# Patient Record
Sex: Male | Born: 1955 | Race: White | Hispanic: Yes | Smoking: Former smoker
Health system: Southern US, Community
[De-identification: ages and names within clinical notes are randomized; demographics above are authoritative.]

## PROBLEM LIST (undated history)

## (undated) DIAGNOSIS — E785 Hyperlipidemia, unspecified: Secondary | ICD-10-CM

## (undated) DIAGNOSIS — B1920 Unspecified viral hepatitis C without hepatic coma: Secondary | ICD-10-CM

## (undated) DIAGNOSIS — R7303 Prediabetes: Secondary | ICD-10-CM

## (undated) HISTORY — PX: TENDON REPAIR: SHX5111

## (undated) HISTORY — DX: Hyperlipidemia, unspecified: E78.5

## (undated) HISTORY — PX: HERNIA REPAIR: SHX51

---

## 2014-08-16 DIAGNOSIS — S46009A Unspecified injury of muscle(s) and tendon(s) of the rotator cuff of unspecified shoulder, initial encounter: Secondary | ICD-10-CM | POA: Insufficient documentation

## 2020-07-26 ENCOUNTER — Other Ambulatory Visit: Payer: Self-pay

## 2020-07-26 ENCOUNTER — Encounter (HOSPITAL_BASED_OUTPATIENT_CLINIC_OR_DEPARTMENT_OTHER): Payer: Self-pay | Admitting: Emergency Medicine

## 2020-07-26 ENCOUNTER — Emergency Department (HOSPITAL_BASED_OUTPATIENT_CLINIC_OR_DEPARTMENT_OTHER)
Admission: EM | Admit: 2020-07-26 | Discharge: 2020-07-26 | Disposition: A | Discharge status: Home / Self Care | Payer: 59 | Attending: Emergency Medicine | Admitting: Emergency Medicine

## 2020-07-26 ENCOUNTER — Emergency Department (HOSPITAL_BASED_OUTPATIENT_CLINIC_OR_DEPARTMENT_OTHER): Payer: 59 | Admitting: Radiology

## 2020-07-26 ENCOUNTER — Emergency Department (HOSPITAL_BASED_OUTPATIENT_CLINIC_OR_DEPARTMENT_OTHER): Payer: 59

## 2020-07-26 DIAGNOSIS — R600 Localized edema: Secondary | ICD-10-CM | POA: Diagnosis not present

## 2020-07-26 DIAGNOSIS — M25461 Effusion, right knee: Secondary | ICD-10-CM

## 2020-07-26 DIAGNOSIS — M25561 Pain in right knee: Secondary | ICD-10-CM | POA: Diagnosis present

## 2020-07-26 MED ORDER — OXYCODONE HCL 5 MG PO TABS
5.0000 mg | ORAL_TABLET | Freq: Once | ORAL | Status: AC
  Administered 2020-07-26: 5 mg via ORAL
  Filled 2020-07-26: qty 1

## 2020-07-26 MED ORDER — OXYCODONE HCL 5 MG PO TABS: 5.0000 mg | ORAL_TABLET | Freq: Every evening | ORAL | 0 refills | Status: AC | PRN

## 2020-07-26 NOTE — Discharge Instructions (Addendum)
Orthopedic Surgery Urgent [facility]           [address],  [place]     [facility]ist [address] [place], [place]  [phone] Closed  Opens tomorrow 5:30 PM  DIRECTIONS  WEBSITE     [facility]: [facility]opedic [facility]gent [facility] Orthopedist [address], [place]  DIRECTIONS  WEBSITE     [facility] Facebook [phone])  Orthopedist [address] [place]  [phone] Closed  Opens tomorrow 8 AM  DIRECTIONS  WEBSITE     [facility] (5)  Sports medicine [address] [place], [place]  [phone] Closed  Opens tomorrow 8:30 AM  DIRECTIONS  [facility] Orthopedist [address], [place]  [phone] Closed  Opens tomorrow 8 AM  DIRECTIONS  WEBSITE   See more results [facility]   [email or web address]   Mar 27, 2017  [facility] is Now Open: Monday -Friday: 8:00 AM to 8:00 PM. Saturday: 10:00 AM to 3:00 PM. When . Saturday: By Appointment Estimated Reading Time: 1 min    [facility]  [email or web address]   Orthopedic Urgent Care [address] [place], [place] [address] EVENINGS & WEEKENDS NO APPOINTMENT NECESSARY Mon-Fri 5:30PM - 9PM Sat 9 AM - 2 PM Sun 10 AM - 2 PM .

## 2020-07-26 NOTE — ED Triage Notes (Signed)
Pt arrives to ED with c/o increased right knee pain x2 days. Pt has chronic knee pain and has had right knee aspiration and Toradol injection into knee in June (6/13). Pt was in for MRI for meniscus tear vs fracture but was unable to do MRI due to insurance issues. Pt has hx of positive McMurrays sign and hx of osteoarthritis.Pt reports selling has increased drastically over last two days.

## 2020-07-26 NOTE — ED Provider Notes (Signed)
[facility] EMERGENCY DEPT Provider Note   CSN: [id number] Arrival date & time: 07/26/20  1645     History Chief Complaint  Patient presents with   Knee Pain    Brian Oliver is a 65 y.o. male.  Brian Oliver is under the care of orthopedics for his knee.  He was doing a job which was exertional, and he developed pain.  Unfortunately, he had to stop seeing the specialist because of lack of insurance.  He currently has insurance, and he has an appointment in August.  However, over the past 3 days, without obvious inciting cause, he has developed worsening pain.  Pain is mostly at the lateral aspect of his knee but also on the posterior aspect.  He also has diffuse swelling of the right lower extremity.  The history is provided by the patient.  Knee Pain Location:  Knee Time since incident:  8 months Knee location:  R knee Pain details:    Quality:  Throbbing   Radiates to:  Does not radiate   Severity:  Moderate   Onset quality:  Gradual   Duration:  8 months   Timing:  Constant   Progression:  Worsening (worse x 3 days) Chronicity:  New Dislocation: no   Foreign body present:  No foreign bodies Prior injury to area:  No Relieved by:  Nothing Worsened by:  Bearing weight Ineffective treatments:  NSAIDs Associated symptoms: decreased ROM and swelling   Associated symptoms: no back pain, no fever, no numbness, no stiffness and no tingling          History reviewed. No pertinent family history.     Home Medications Prior to Admission medications   Not on File    Allergies    Patient has no allergy information on record.  Review of Systems   Review of Systems  Constitutional:  Negative for chills and fever.  HENT:  Negative for ear pain and sore throat.   Eyes:  Negative for pain and visual disturbance.  Respiratory:  Negative for cough and shortness of breath.   Cardiovascular:  Positive for leg swelling. Negative for chest pain and  palpitations.  Gastrointestinal:  Negative for abdominal pain and vomiting.  Genitourinary:  Negative for dysuria and hematuria.  Musculoskeletal:  Positive for joint swelling. Negative for arthralgias, back pain and stiffness.  Skin:  Negative for color change and rash.  Neurological:  Negative for seizures and syncope.  All other systems reviewed and are negative.  Physical Exam Updated Vital Signs BP (!) 141/68   Pulse 67   Temp 97.9 F (36.6 C) (Oral)   Resp 18   Ht 5\' 5"  (1.651 m)   Wt 84.8 kg   SpO2 100%   BMI 31.12 kg/m   Physical Exam Vitals and nursing note reviewed.  Constitutional:      Appearance: Normal appearance.  HENT:     Head: Normocephalic and atraumatic.  Eyes:     Conjunctiva/sclera: Conjunctivae normal.  Pulmonary:     Effort: Pulmonary effort is normal. No respiratory distress.  Musculoskeletal:        General: No deformity. Normal range of motion.     Cervical back: Normal range of motion.     Right lower leg: Edema present.     Comments: There is no deformity of the right knee.  There is a 2+ effusion and diffuse tenderness to palpation.  Range of motion is from 0 to 30 degrees and is painful.  Further provocative testing  was not performed secondary to his pain.  He also has swelling of the entire right lower leg.  Distal pulses are palpable.  He has a positive Homans' sign.  Sensation is normal.  Skin:    General: Skin is warm and dry.  Neurological:     General: No focal deficit present.     Mental Status: He is alert and oriented to person, place, and time. Mental status is at baseline.  Psychiatric:        Mood and Affect: Mood normal.    ED Results / Procedures / Treatments   Labs (all labs ordered are listed, but only abnormal results are displayed) Labs Reviewed - No data to display  EKG None  Radiology US Venous Img Lower Unilateral Right  Result Date: 07/26/2020 CLINICAL DATA:  Right knee pain/swelling, chronic EXAM: RIGHT  LOWER EXTREMITY VENOUS DOPPLER ULTRASOUND TECHNIQUE: Gray-scale sonography with compression, as well as color and duplex ultrasound, were performed to evaluate the deep venous system(s) from the level of the common femoral vein through the popliteal and proximal calf veins. COMPARISON:  None. FINDINGS: VENOUS Normal compressibility of the common femoral, superficial femoral, and popliteal veins, as well as the visualized calf veins. Visualized portions of profunda femoral vein and great saphenous vein unremarkable. No filling defects to suggest DVT on grayscale or color Doppler imaging. Doppler waveforms show normal direction of venous flow, normal respiratory plasticity and response to augmentation. Limited views of the contralateral common femoral vein are unremarkable. OTHER None. Limitations: none IMPRESSION: Negative. Electronically Signed   By: Charline Bills M.D.   On: 07/26/2020 22:31   DG Knee Complete 4 Views Right  Result Date: 07/26/2020 CLINICAL DATA:  Knee pain and swelling.  Increased pain for 2 days. EXAM: RIGHT KNEE - COMPLETE 4+ VIEW COMPARISON:  None. FINDINGS: No fracture or dislocation. Normal alignment. Mild tibiofemoral joint space narrowing. Mild tricompartmental peripheral spurs. Mild spurring of the tibial spines. No erosion, bone destruction or periosteal reaction. There is a moderate knee joint effusion. Generalized soft tissue edema. IMPRESSION: 1. Moderate knee joint effusion and generalized soft tissue edema. 2. Mild tricompartmental osteoarthritis. No acute osseous abnormality. Electronically Signed   By: Narda Rutherford M.D.   On: 07/26/2020 19:41    Procedures Procedures   Medications Ordered in ED Medications  oxyCODONE (Oxy IR/ROXICODONE) immediate release tablet 5 mg (has no administration in time range)    ED Course  I have reviewed the triage vital signs and the nursing notes.  Pertinent labs & imaging results that were available during my care of the  patient were reviewed by me and considered in my medical decision making (see chart for details).    MDM Rules/Calculators/A&P                           Vitor Overbaugh has had chronic knee pain for a little less than a year.  Follow-up has been delayed due to changing jobs and a change in insurances.  He presented today with acute on chronic pain.  Symptoms are consistent with his chronic knee pain.  X-ray revealed known, mild osteoarthritis and a knee effusion.  This does not appear to be consistent with a septic joint.  He also has some lower extremity edema that I was concerned might be a DVT.  I was concerned that his relative sedentary nature since his injury had contributed to a risk for venous thromboembolism.  Ultrasound was negative for  this pathology.  It is possible he has some dependent edema or even a ruptured popliteal cyst.  He was given oxycodone for use at nighttime only and advised on side effects of this medication.  He was presented with a list of orthopedic urgent cares and their contact information.  He does have follow-up in August with another doctor, but if he wants to be seen sooner than that, he was given resources to do so. Final Clinical Impression(s) / ED Diagnoses Final diagnoses:  Effusion of right knee  Edema of right lower leg    Rx / DC Orders ED Discharge Orders          Ordered    oxyCODONE (ROXICODONE) 5 MG immediate release tablet  At bedtime PRN        07/26/20 2315             Koleen Distance, MD 07/26/20 2317

## 2020-08-08 ENCOUNTER — Other Ambulatory Visit: Payer: Self-pay

## 2020-08-08 ENCOUNTER — Encounter (HOSPITAL_BASED_OUTPATIENT_CLINIC_OR_DEPARTMENT_OTHER): Payer: Self-pay | Admitting: Family Medicine

## 2020-08-08 ENCOUNTER — Ambulatory Visit (INDEPENDENT_AMBULATORY_CARE_PROVIDER_SITE_OTHER): Payer: 59 | Admitting: Family Medicine

## 2020-08-08 DIAGNOSIS — E785 Hyperlipidemia, unspecified: Secondary | ICD-10-CM

## 2020-08-08 DIAGNOSIS — M25461 Effusion, right knee: Secondary | ICD-10-CM | POA: Diagnosis not present

## 2020-08-08 DIAGNOSIS — M25361 Other instability, right knee: Secondary | ICD-10-CM | POA: Diagnosis not present

## 2020-08-08 DIAGNOSIS — E669 Obesity, unspecified: Secondary | ICD-10-CM | POA: Diagnosis not present

## 2020-08-08 NOTE — Progress Notes (Signed)
New Patient Office Visit  Subjective:  Patient ID: Brian Oliver, male    DOB: 1955/05/12  Age: 65 y.o. MRN: [id number]  CC:  Chief Complaint  Patient presents with   Establish Care    No Previous PCP   Knee Injury    Patient presents today with right knee pain and swelling that radiates down into his leg towards his shin since December 2021. Patient thinks the initial injury came from lifting weights. He was being treated by Ortho at [facility]. Patient states he has taken meloxicam and naproxen for the pain with no relief. He states his last Orthopedic doc drained fluid off of the knee and gave him steroid injections which gave him minimal relief. He was advised to have an MRI but never did due to insurance.   Hyperlipidemia    Patient states he has been diagnosed with hyperlipidemia in the past based on labs but has not been prescribed any medication to manage.     HPI Brian Oliver is a 65 year old male presenting to establish in clinic.  He is accompanied today by his son.  Virtual medical interpreter available to assist in patient encounter.  He has current issues related to right knee pain and swelling.  Reports past medical history of hyperlipidemia.  Right knee pain/swelling: Pain initially started about 10 months ago.  At that time, there was no specific injury, pain developed after working to do a lot of unloading with moderately heavy lifting about 25 to 50 pounds.  He was seen by orthopedic surgeon initially with knee aspiration and injection completed.  Aspiration did not reveal any intra-articular crystals, no evidence of infection at that time.  Had intra-articular steroid injection completed which did provide some relief, however was not long lasting.  He also had a Toradol injection as well as subsequent intra-articular steroid injection which was most recently completed in June.  The second intra-articular steroid injection did not provide notable relief.  Patient has  been using NSAID to help with controlling symptoms.  Pain is worse with walking, prolonged standing, bending the knee.  Also has some associated crepitus.  Knee will also feel unstable at times when walking.  Most recently, was seen in the emergency department for evaluation due to persistent pain and swelling as well as some swelling in his lower leg.  At that time ultrasound was completed to rule out DVT and this was unremarkable.  Patient has had a change of insurance and thus is not able to follow-up with orthopedic surgeon that he was seeing.  Hyperlipidemia: Reports being told about this in the past, no specific medications reported.  Has not had recent PCP.  Unsure of when most recent labs were completed.  Past Medical History:  Diagnosis Date   Hyperlipidemia     History reviewed. No pertinent surgical history.  Family History  Problem Relation Age of Onset   Hypertension Mother    Stroke Father     Social History   Socioeconomic History   Marital status: Married    Spouse name: Not on file   Number of children: Not on file   Years of education: Not on file   Highest education level: Not on file  Occupational History   Not on file  Tobacco Use   Smoking status: Never    Passive exposure: Never   Smokeless tobacco: Never  Vaping Use   Vaping Use: Never used  Substance and Sexual Activity   Alcohol use: Yes  Alcohol/week: 1.0 standard drink    Types: 1 Cans of beer per week   Drug use: Not on file   Sexual activity: Not Currently  Other Topics Concern   Not on file  Social History Narrative   Not on file   Social Determinants of Health   Financial Resource Strain: Not on file  Food Insecurity: Not on file  Transportation Needs: Not on file  Physical Activity: Not on file  Stress: Not on file  Social Connections: Not on file  Intimate Partner Violence: Not on file   Objective:   Today's Vitals: BP 132/72   Pulse 77   Ht 5\' 5"  (1.651 m)   Wt 193 lb  3.2 oz (87.6 kg)   SpO2 97%   BMI 32.15 kg/m   Physical Exam  65 year old male in no acute distress Cardiovascular exam with regular rate and rhythm, no murmurs appreciated Lungs clear to auscultation bilaterally Right knee: No obvious deformity. Moderate effusion.  Negative patellar grind.  Positive crepitus. Full ROM for flexion and extension.  Strength 5 out of 5 for flexion and extension. Anterior drawer: Negative Posterior drawer: Negative Lachman: Negative Varus stress test: Negative Valgus stress test: Positive McMurray's: Positive Thessaly: Positive Neurovascularly intact.  No evidence of lymphatic disease.   Assessment & Plan:   Problem List Items Addressed This Visit       Musculoskeletal and Integument   Effusion of right knee    See below         Other   Hyperlipidemia    Noted in the past, will check labs today and determine ASCVD risk score and determine need for statin therapy       Obesity, Class I, BMI 30-34.9    Check labs as below Current musculoskeletal issues are limiting ability to be physically active Focus on alternative lifestyle modifications related to diet Monitor weight at future visits Hopefully with resolution of right knee issues, can transition into increasing physical activity       Instability of right knee joint    Patient with chronic right knee pain with associated effusion, instability Concern for internal derangement, possible meniscal tear, possible bony abnormality Reviewed x-rays completed most recently in the emergency department, evidence of joint effusion, joint space narrowing Given effusion, instability, lack of improvement with conservative measures thus far, will order MRI to evaluate further Can continue with conservative measures to help with pain control, recommend elevation of the leg to help with reducing swelling, discussed that this would need to be higher than the level of the heart in order to be of  benefit        No outpatient encounter medications on file as of 08/08/2020.   No facility-administered encounter medications on file as of 08/08/2020.   Spent 45 minutes on this patient encounter, including preparation, chart review, face-to-face counseling with patient and coordination of care, and documentation of encounter  Follow-up: No follow-ups on file.  We will arrange for follow-up once MRI is approved and scheduled  Pavel Gadd J De 10/08/2020, MD

## 2020-08-08 NOTE — Assessment & Plan Note (Signed)
Noted in the past, will check labs today and determine ASCVD risk score and determine need for statin therapy

## 2020-08-08 NOTE — Assessment & Plan Note (Signed)
See below

## 2020-08-08 NOTE — Assessment & Plan Note (Signed)
Check labs as below Current musculoskeletal issues are limiting ability to be physically active Focus on alternative lifestyle modifications related to diet Monitor weight at future visits Hopefully with resolution of right knee issues, can transition into increasing physical activity

## 2020-08-08 NOTE — Patient Instructions (Signed)
  Medication Instructions:  Your physician recommends that you continue on your current medications as directed. Please refer to the Current Medication list given to you today. --If you need a refill on any your medications before your next appointment, please call your pharmacy first. If no refills are authorized on file call the office.--  Lab Work: Your physician has recommended that you have lab work today: CBC, CMP, Lipid Profile, and A1C If you have labs (blood work) drawn today and your tests are completely normal, you will receive your results via MyChart message OR a phone call from our staff.  Please ensure you check your voicemail in the event that you authorized detailed messages to be left on a delegated number. If you have any lab test that is abnormal or we need to change your treatment, we will call you to review the results.  Referrals/Procedures/Imaging: Your physician has requested that you have an MRI or your right knee. Magnetic resonance imaging, or MRI, is a noninvasive medical imaging test that produces detailed images of almost every internal structure in the human body, including the organs, bones, muscles and blood vessels. MRI scanners create images of the body using a large magnet and radio waves.  Follow-Up: Your next appointment:   Your physician recommends that you schedule a follow-up appointment TBD with Dr. de Peru  Thanks for letting us be apart of your health journey!!  Primary Care and Sports Medicine   Dr. Ceasar Mons Peru   We encourage you to activate your patient portal called "MyChart".  Sign up information is provided on this After Visit Summary.  MyChart is used to connect with patients for Virtual Visits (Telemedicine).  Patients are able to view lab/test results, encounter notes, upcoming appointments, etc.  Non-urgent messages can be sent to your provider as well. To learn more about what you can do with MyChart, please visit --   [email or web address].

## 2020-08-08 NOTE — Assessment & Plan Note (Signed)
Patient with chronic right knee pain with associated effusion, instability Concern for internal derangement, possible meniscal tear, possible bony abnormality Reviewed x-rays completed most recently in the emergency department, evidence of joint effusion, joint space narrowing Given effusion, instability, lack of improvement with conservative measures thus far, will order MRI to evaluate further Can continue with conservative measures to help with pain control, recommend elevation of the leg to help with reducing swelling, discussed that this would need to be higher than the level of the heart in order to be of benefit

## 2020-08-09 LAB — CBC WITH DIFFERENTIAL/PLATELET
Basophils Absolute: 0 10*3/uL (ref 0.0–0.2)
Basos: 1 %
EOS (ABSOLUTE): 0.2 10*3/uL (ref 0.0–0.4)
Eos: 3 %
Hematocrit: 34.3 % — ABNORMAL LOW (ref 37.5–51.0)
Hemoglobin: 11.7 g/dL — ABNORMAL LOW (ref 13.0–17.7)
Immature Grans (Abs): 0 10*3/uL (ref 0.0–0.1)
Immature Granulocytes: 0 %
Lymphocytes Absolute: 1.4 10*3/uL (ref 0.7–3.1)
Lymphs: 23 %
MCH: 31.3 pg (ref 26.6–33.0)
MCHC: 34.1 g/dL (ref 31.5–35.7)
MCV: 92 fL (ref 79–97)
Monocytes Absolute: 0.4 10*3/uL (ref 0.1–0.9)
Monocytes: 7 %
Neutrophils Absolute: 4.1 10*3/uL (ref 1.4–7.0)
Neutrophils: 66 %
Platelets: 328 10*3/uL (ref 150–450)
RBC: 3.74 x10E6/uL — ABNORMAL LOW (ref 4.14–5.80)
RDW: 13.2 % (ref 11.6–15.4)
WBC: 6.2 10*3/uL (ref 3.4–10.8)

## 2020-08-09 LAB — COMPREHENSIVE METABOLIC PANEL
ALT: 11 IU/L (ref 0–44)
AST: 13 IU/L (ref 0–40)
Albumin/Globulin Ratio: 1.1 — ABNORMAL LOW (ref 1.2–2.2)
Albumin: 3.9 g/dL (ref 3.8–4.8)
Alkaline Phosphatase: 90 IU/L (ref 44–121)
BUN/Creatinine Ratio: 26 — ABNORMAL HIGH (ref 10–24)
BUN: 19 mg/dL (ref 8–27)
Bilirubin Total: 0.2 mg/dL (ref 0.0–1.2)
CO2: 23 mmol/L (ref 20–29)
Calcium: 9 mg/dL (ref 8.6–10.2)
Chloride: 105 mmol/L (ref 96–106)
Creatinine, Ser: 0.74 mg/dL — ABNORMAL LOW (ref 0.76–1.27)
Globulin, Total: 3.5 g/dL (ref 1.5–4.5)
Glucose: 89 mg/dL (ref 65–99)
Potassium: 4.3 mmol/L (ref 3.5–5.2)
Sodium: 141 mmol/L (ref 134–144)
Total Protein: 7.4 g/dL (ref 6.0–8.5)
eGFR: 100 mL/min/{1.73_m2} (ref >59)

## 2020-08-09 LAB — HEMOGLOBIN A1C
Est. average glucose Bld gHb Est-mCnc: 126 mg/dL
Hgb A1c MFr Bld: 6 % — ABNORMAL HIGH (ref 4.8–5.6)

## 2020-08-09 LAB — LIPID PANEL
Chol/HDL Ratio: 4.9 ratio (ref 0.0–5.0)
Cholesterol, Total: 215 mg/dL — ABNORMAL HIGH (ref 100–199)
HDL: 44 mg/dL (ref >39)
LDL Chol Calc (NIH): 146 mg/dL — ABNORMAL HIGH (ref 0–99)
Triglycerides: 137 mg/dL (ref 0–149)
VLDL Cholesterol Cal: 25 mg/dL (ref 5–40)

## 2020-08-10 ENCOUNTER — Other Ambulatory Visit (HOSPITAL_BASED_OUTPATIENT_CLINIC_OR_DEPARTMENT_OTHER): Payer: Self-pay

## 2020-08-14 ENCOUNTER — Telehealth (HOSPITAL_BASED_OUTPATIENT_CLINIC_OR_DEPARTMENT_OTHER): Payer: Self-pay

## 2020-08-14 NOTE — Telephone Encounter (Signed)
Per DPR spoke with patients son He is aware and agreeable to lab results and recommendations Patients son will schedule follow up after MRI is complete

## 2020-08-14 NOTE — Telephone Encounter (Signed)
-----   Message from Hosie Poisson Peru, MD sent at 08/09/2020  8:51 AM EDT ----- Normal white blood cell count with slightly low red blood cell count and slightly low hemoglobin.  Normal electrolytes, kidney function and liver function.  Hemoglobin A1c is 6.0% which falls within "prediabetes" range.  This indicates increased risk of developing diabetes in the future.  Can discuss management further at next office visit. Lipid panel with elevated total cholesterol and elevated "bad" cholesterol.  Would focus on lifestyle modifications to address cholesterol issues.  This will include dietary changes - incorporating fresh fruits and vegetables, lean protein in the diet and reducing consumption of red meats, saturated fats, processed foods.

## 2020-08-20 ENCOUNTER — Other Ambulatory Visit: Payer: Self-pay

## 2020-08-20 ENCOUNTER — Ambulatory Visit (INDEPENDENT_AMBULATORY_CARE_PROVIDER_SITE_OTHER): Payer: 59

## 2020-08-20 DIAGNOSIS — M25461 Effusion, right knee: Secondary | ICD-10-CM | POA: Diagnosis not present

## 2020-08-20 DIAGNOSIS — M25361 Other instability, right knee: Secondary | ICD-10-CM | POA: Diagnosis not present

## 2020-08-25 ENCOUNTER — Encounter (HOSPITAL_BASED_OUTPATIENT_CLINIC_OR_DEPARTMENT_OTHER): Payer: Self-pay | Admitting: Family Medicine

## 2020-08-25 ENCOUNTER — Other Ambulatory Visit: Payer: Self-pay

## 2020-08-25 ENCOUNTER — Ambulatory Visit (INDEPENDENT_AMBULATORY_CARE_PROVIDER_SITE_OTHER): Payer: 59 | Admitting: Family Medicine

## 2020-08-25 VITALS — BP 160/78 | HR 67 | Ht 65.0 in | Wt 190.6 lb

## 2020-08-25 DIAGNOSIS — R7303 Prediabetes: Secondary | ICD-10-CM | POA: Diagnosis not present

## 2020-08-25 DIAGNOSIS — M25461 Effusion, right knee: Secondary | ICD-10-CM

## 2020-08-25 DIAGNOSIS — E785 Hyperlipidemia, unspecified: Secondary | ICD-10-CM | POA: Diagnosis not present

## 2020-08-25 NOTE — Assessment & Plan Note (Signed)
Unremarkable since last visit, results reviewed with patient and son who accompanied patient to visit today Complex effusion observed, degenerative changes of joint, degenerative tearing of meniscus, reactive marrow edema Reviewed prior labs and notes from prior orthopedic surgery visits; synovial fluid analysis was completed earlier this year.  At that time, no crystals were observed, patient had elevated polynuclear cells. Blood work with prior PCP did reveal a positive rheumatoid factor.  With orthopedic surgery, patient has had intra-articular corticosteroid injection as well as intra-articular Toradol injection previously. Given lack of response to injections, findings on prior synovial fluid analysis, findings on recent MRI, will refer to orthopedic surgery for further evaluation and treatment recommendations

## 2020-08-25 NOTE — Progress Notes (Signed)
    Procedures performed today:    None.  Independent interpretation of notes and tests performed by another provider:   None.  Brief History, Exam, Impression, and Recommendations:    BP (!) 160/78   Pulse 67   Ht 5\' 5"  (1.651 m)   Wt 190 lb 9.6 oz (86.5 kg)   SpO2 98%   BMI 31.72 kg/m   Effusion of right knee Unremarkable since last visit, results reviewed with patient and son who accompanied patient to visit today Complex effusion observed, degenerative changes of joint, degenerative tearing of meniscus, reactive marrow edema Reviewed prior labs and notes from prior orthopedic surgery visits; synovial fluid analysis was completed earlier this year.  At that time, no crystals were observed, patient had elevated polynuclear cells. Blood work with prior PCP did reveal a positive rheumatoid factor.  With orthopedic surgery, patient has had intra-articular corticosteroid injection as well as intra-articular Toradol injection previously. Given lack of response to injections, findings on prior synovial fluid analysis, findings on recent MRI, will refer to orthopedic surgery for further evaluation and treatment recommendations  Hyperlipidemia Labs completed recently, ASCVD risk score found to be 21.6% Will discuss further regarding treatment recommendations, likely recommend statin therapy Did discuss lifestyle modifications today, primarily focusing on dietary changes given ongoing orthopedic concerns  Prediabetes Recent hemoglobin A1c found to be slightly above normal range at 6.0% Discussed this finding, what this elevation means, recommendations of lifestyle modifications, specifically focusing on dietary changes given orthopedic concerns Will discuss further at follow-up visit in 2 months  Plan for follow-up in about 2 months or sooner as needed   ___________________________________________ Tymon Nemetz de , MD, ABFM, CAQSM Primary Care and Sports Medicine [facility]

## 2020-08-25 NOTE — Assessment & Plan Note (Signed)
Labs completed recently, ASCVD risk score found to be 21.6% Will discuss further regarding treatment recommendations, likely recommend statin therapy Did discuss lifestyle modifications today, primarily focusing on dietary changes given ongoing orthopedic concerns

## 2020-08-25 NOTE — Assessment & Plan Note (Signed)
Recent hemoglobin A1c found to be slightly above normal range at 6.0% Discussed this finding, what this elevation means, recommendations of lifestyle modifications, specifically focusing on dietary changes given orthopedic concerns Will discuss further at follow-up visit in 2 months

## 2020-08-25 NOTE — Patient Instructions (Signed)
  Medication Instructions:  Your physician recommends that you continue on your current medications as directed. Please refer to the Current Medication list given to you today. --If you need a refill on any your medications before your next appointment, please call your pharmacy first. If no refills are authorized on file call the office.--  Referrals/Procedures/Imaging: A referral has been placed for you to Orthopedic Sure[facility] at [facility] -- Dr. Ella Jubilee for evaluation and treatment. Someone from the scheduling department will be in contact with you in regards to coordinating your consultation. If you do not hear from any of the schedulers within 7-10 business days please give our office a call.  Follow-Up: Your next appointment:   Your physician recommends that you schedule a follow-up appointment in: 2-3 MONTHS with Dr. de Peru  Thanks for letting us be apart of your health journey!!  Primary Care and Sports Medicine   Dr. Ceasar Mons [place]   We encourage you to activate your patient portal called "MyChart".  Sign up information is provided on this After Visit Summary.  MyChart is used to connect with patients for Virtual Visits (Telemedicine).  Patients are able to view lab/test results, encounter notes, upcoming appointments, etc.  Non-urgent messages can be sent to your provider as well. To learn more about what you can do with MyChart, please visit --  [email or web address].

## 2020-09-12 ENCOUNTER — Other Ambulatory Visit: Payer: Self-pay

## 2020-09-12 ENCOUNTER — Encounter (HOSPITAL_BASED_OUTPATIENT_CLINIC_OR_DEPARTMENT_OTHER): Payer: Self-pay | Admitting: Orthopaedic Surgery

## 2020-09-12 ENCOUNTER — Ambulatory Visit (INDEPENDENT_AMBULATORY_CARE_PROVIDER_SITE_OTHER): Payer: 59 | Admitting: Orthopaedic Surgery

## 2020-09-12 VITALS — BP 138/71 | Ht 66.0 in | Wt 191.4 lb

## 2020-09-12 DIAGNOSIS — M1711 Unilateral primary osteoarthritis, right knee: Secondary | ICD-10-CM

## 2020-09-12 DIAGNOSIS — M25561 Pain in right knee: Secondary | ICD-10-CM | POA: Diagnosis not present

## 2020-09-12 NOTE — Progress Notes (Signed)
Chief Complaint: Right knee pain     History of Present Illness:   Pain Score: 7/10 SANE: 50/100  Brian Oliver is a 65 y.o. male with right knee pain since the fall 2021.  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  He states that he has pain essentially at rest but particularly with activity.  He has pain with walking any significant amount of distance.  He has been taking ibuprofen 800 mg although he does not want to be unnecessarily taking this forever.  He experiences clicking and popping in the knee.  He has had 3 steroid injections into the right knee that were initially efficacious for several months.  His most recent injection several months ago provided no relief.  He is here today asking about potential treatment options    Surgical History:   None  PMH/PSH/Family History/Social History/Meds/Allergies:    Past Medical History:  Diagnosis Date   Hyperlipidemia    History reviewed. No pertinent surgical history. Social History   Socioeconomic History   Marital status: Married    Spouse name: Not on file   Number of children: Not on file   Years of education: Not on file   Highest education level: Not on file  Occupational History   Not on file  Tobacco Use   Smoking status: Never    Passive exposure: Never   Smokeless tobacco: Never  Vaping Use   Vaping Use: Never used  Substance and Sexual Activity   Alcohol use: Yes    Alcohol/week: 1.0 standard drink    Types: 1 Cans of beer per week   Drug use: Not on file   Sexual activity: Not Currently  Other Topics Concern   Not on file  Social History Narrative   Not on file   Social Determinants of Health   Financial Resource Strain: Not on file  Food Insecurity: Not on file  Transportation Needs: Not on file  Physical Activity: Not on file   Stress: Not on file  Social Connections: Not on file   Family History  Problem Relation Age of Onset   Hypertension Mother    Stroke Father    No Known Allergies No current outpatient medications on file.   No current facility-administered medications for this visit.   No results found.  Review of Systems:   A ROS was performed including pertinent positives and negatives as documented in the HPI.  Physical Exam :   Constitutional: NAD and appears stated age Neurological: Alert and oriented Psych: Appropriate affect and cooperative Blood pressure 138/71, height 5\' 6"  (1.676 m), weight 191 lb 6.4 oz (86.8 kg).   Comprehensive Musculoskeletal Exam:     Musculoskeletal Exam  Gait Normal  Alignment Normal   Right Left  Inspection Normal Normal  Palpation    Tenderness Diffuse all 3 compartments None  Crepitus Positive None  Effusion 1+ None  Range of Motion    Extension 0 0  Flexion 125 1255  Strength    Extension 5/5 5/5  Flexion 5/5 5/5  Ligament Exam     Generalized Laxity No No  Lachman Negative Negative   Pivot Shift Negative Negative  Anterior Drawer  Negative Negative  Valgus at 0 Negative Negative  Valgus at 20 Negative Negative  Varus at 0 0 0  Varus at 20   0 0  Posterior Drawer at 90 0 0  Vascular/Lymphatic Exam    Edema None None  Venous Stasis Changes No No  Distal Circulation Normal Normal  Neurologic    Light Touch Sensation Intact Intact  Special Tests:      Imaging:   Xray (Right knee 3 view): Tricompartmental knee osteoarthritis  MRI (right knee): Tricompartmental knee osteoarthritis  I personally reviewed and interpreted the radiographs.   Assessment:   65 year old male with advanced tricompartmental knee osteoarthritis.  Given the fact that he has failed nonoperative treatment with injections I do believe he would be a candidate for total knee arthroplasty.  To that regard I will have him plan to see Dr. Magnus Ivan for an  assessment of total knee arthroplasty.  Plan :    -Referral to Dr. Magnus Ivan for consideration of total knee arthroplasty     I personally saw and evaluated the patient, and participated in the management and treatment plan.  Huel Cote, MD Attending Physician, Orthopedic Surgery  This document was dictated using Dragon voice recognition software. A reasonable attempt at proof reading has been made to minimize errors.

## 2020-10-02 ENCOUNTER — Other Ambulatory Visit: Payer: Self-pay

## 2020-10-02 ENCOUNTER — Ambulatory Visit (INDEPENDENT_AMBULATORY_CARE_PROVIDER_SITE_OTHER): Payer: 59 | Admitting: Orthopaedic Surgery

## 2020-10-02 ENCOUNTER — Encounter: Payer: Self-pay | Admitting: Orthopaedic Surgery

## 2020-10-02 DIAGNOSIS — G8929 Other chronic pain: Secondary | ICD-10-CM | POA: Diagnosis not present

## 2020-10-02 DIAGNOSIS — M25561 Pain in right knee: Secondary | ICD-10-CM

## 2020-10-02 DIAGNOSIS — M1711 Unilateral primary osteoarthritis, right knee: Secondary | ICD-10-CM

## 2020-10-02 MED ORDER — DICLOFENAC SODIUM 75 MG PO TBEC: 75.0000 mg | DELAYED_RELEASE_TABLET | Freq: Two times a day (BID) | ORAL | 3 refills | Status: DC | PRN

## 2020-10-02 NOTE — Addendum Note (Signed)
Addended by: Doneen Poisson on: 10/02/2020 04:15 PM   Modules accepted: Orders

## 2020-10-02 NOTE — Progress Notes (Signed)
Office Visit Note   Patient: Brian Oliver           Date of Birth: 10-Jun-1955           MRN: [id number] Visit Date: 10/02/2020              Requested by: Huel Cote, MD [address] [place],  [place] [address] PCP: de Peru, Buren Kos, MD   Assessment & Plan: Visit Diagnoses:  1. Unilateral primary osteoarthritis, right knee   2. Chronic pain of right knee     Plan: I went over knee replacement surgery with the patient in detail as well as his family.  We went over his plain film findings and showed him the x-rays.  I showed them a knee replacement model and described in detail what the surgery involves from an intraoperative and postoperative course.  I discussed the risks and benefits of knee replacement surgery as well.  All questions and concerns were answered and addressed.  Given the failed conservative treatment for over a year at this point combined with his clinical exam findings and x-ray and MRI findings, a right knee replacement is warranted.  We will be in touch about scheduling the surgery.  Follow-Up Instructions: Return for 2 weeks post-op.   Orders:  No orders of the defined types were placed in this encounter.  No orders of the defined types were placed in this encounter.     Procedures: No procedures performed   Clinical Data: No additional findings.   Subjective: Chief Complaint  Patient presents with   Right Knee - Pain  The patient comes in today as a referral from my partner Dr. Steward Drone to evaluate and treat tricompartmental arthritis of the right knee and consider him for knee replacement surgery.  He does have family with him who is interpreting.  He is non-English-speaking.  He has a MRI and x-rays on the canopy system for me to review.  He has been having worsening knee pain for many years now.  He has had multiple steroid injections and has been on ibuprofen or meloxicam and none of that is helped.  Diclofenac has helped some.   He is interested in talking about knee replacement surgery because at this point his right knee pain is daily and it is detrimentally affecting his mobility, his quality of life and his actives daily living.  He is a very active 65 year old gentleman and wants to have better quality of life given the severity of his arthritis of his right knee.  He does wish to discuss knee replacement surgery today.  HPI  Review of Systems There is currently listed no headache, chest pain, shortness of breath, fever, chills, nausea, vomiting  Objective: Vital Signs: There were no vitals taken for this visit.  Physical Exam He is alert and oriented x3 and in no acute distress Ortho Exam Examination of his right knee does show moderate effusion.  The left knee exam is entirely normal.  His right painful knee has medial lateral joint tenderness with more or less neutral alignment.  There is patellofemoral crepitation and a painful arc of motion of the right knee. Specialty Comments:  No specialty comments available.  Imaging: No results found. Plain films and MRI of the right knee shows tricompartment arthritic changes.  There is significant cartilage wear and joint space narrowing in all 3 compartments.  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 No pertinent surgical history. Social History   Occupational History   Not on file  Tobacco Use   Smoking status: Never    Passive exposure: Never   Smokeless tobacco: Never  Vaping Use   Vaping Use: Never used  Substance and Sexual Activity   Alcohol use: Yes    Alcohol/week:  1.0 standard drink    Types: 1 Cans of beer per week   Drug use: Not on file   Sexual activity: Not Currently

## 2020-11-28 ENCOUNTER — Ambulatory Visit (HOSPITAL_BASED_OUTPATIENT_CLINIC_OR_DEPARTMENT_OTHER): Payer: 59 | Admitting: Family Medicine

## 2020-12-11 ENCOUNTER — Encounter (HOSPITAL_BASED_OUTPATIENT_CLINIC_OR_DEPARTMENT_OTHER): Payer: Self-pay | Admitting: Family Medicine

## 2020-12-12 ENCOUNTER — Telehealth: Payer: Self-pay

## 2020-12-12 NOTE — Telephone Encounter (Signed)
[phone] son Blossom Hoops called to speak with Liz/spanish about setting up surgery for parent

## 2020-12-22 NOTE — Telephone Encounter (Signed)
Called using [employer], left voice mail for return call.

## 2021-01-18 NOTE — Telephone Encounter (Signed)
Called patient, with Liz's help speaking Spanish, scheduled surgery.

## 2021-01-23 ENCOUNTER — Other Ambulatory Visit: Payer: Self-pay

## 2021-02-08 ENCOUNTER — Other Ambulatory Visit: Payer: Self-pay | Admitting: Physician Assistant

## 2021-02-08 DIAGNOSIS — M1711 Unilateral primary osteoarthritis, right knee: Secondary | ICD-10-CM

## 2021-02-22 NOTE — Patient Instructions (Signed)
DUE TO COVID-19 ONLY ONE VISITOR IS ALLOWED TO COME WITH YOU AND STAY IN THE WAITING ROOM ONLY DURING PRE OP AND PROCEDURE.   **NO VISITORS ARE ALLOWED IN THE SHORT STAY AREA OR RECOVERY ROOM!!**  IF YOU WILL BE ADMITTED INTO THE HOSPITAL YOU ARE ALLOWED ONLY TWO SUPPORT PEOPLE DURING VISITATION HOURS ONLY (7 AM -8PM)   The support person(s) must pass our screening, gel in and out, and wear a mask at all times, including in the patients room. Patients must also wear a mask when staff or their support person are in the room. Visitors GUEST BADGE MUST BE WORN VISIBLY  One adult visitor may remain with you overnight and MUST be in the room by 8 P.M.  No visitors under the age of 68. Any visitor under the age of 68 must be accompanied by an adult.    COVID SWAB TESTING MUST BE COMPLETED ON:  02/28/21   [facility] [address] [place] Enter: Main Entrance have a seat in the waiting area to the right of main entrance (DO NOT CHECK IN AT ADMITTING!!!!!) Dial: [phone] to alert staff you have arrived  You are not required to quarantine, however you are required to wear a well-fitted mask when you are out and around people not in your household.  Hand Hygiene often Do NOT share personal items Notify your provider if you are in close contact with someone who has COVID or you develop fever 100.4 or greater, new onset of sneezing, cough, sore throat, shortness of breath or body aches.  [facility] Entrance [address], [address], must go inside of the hospital, NOT A DRIVE THRU!  (Must self quarantine after testing. Follow instructions on handout.)       Your procedure is scheduled on: 03/02/21   [facility] Main [facility]trance    Report to admitting at : 8:15 AM   Call this number if you have problems the morning of surgery [phone]   Do not eat food :After Midnight.   May have liquids until: 8:00 AM     day of surgery  CLEAR LIQUID DIET  Foods Allowed                                                                     Foods Excluded  Water, Black Coffee and tea, regular and decaf                             liquids that you cannot  Plain Jell-O in any flavor  (No red)                                           see through such as: Fruit ices (not with fruit pulp)                                     milk, soups, orange juice  Iced Popsicles (No red)                                    All solid food                                   Apple juices Sports drinks like Gatorade (No red) Lightly seasoned clear broth or consume(fat free) Sugar Sample Menu Breakfast                                Lunch                                     Supper Cranberry juice                    Beef broth                            Chicken broth Jell-O                                     Grape juice                           Apple juice Coffee or tea                        Jell-O                                      Popsicle                                                Coffee or tea                        Coffee or tea   Complete one Gatorade drink the morning of surgery 3 hours prior to scheduled surgery at: 8:00 AM.    The day of surgery:  Drink ONE (1) Pre-Surgery Clear Ensure or G2 at AM the morning of surgery. Drink in one sitting. Do not sip.  This drink was given to you during your hospital  pre-op appointment visit. Nothing else to drink after completing the  Pre-Surgery Clear Ensure or G2.          If you have questions, please contact your surgeons office.  FOLLOW BOWEL PREP AND ANY ADDITIONAL PRE OP INSTRUCTIONS YOU RECEIVED FROM YOUR SURGEON'S OFFICE!!!    Oral Hygiene is also important to reduce your risk of infection.                                    Remember - BRUSH YOUR TEETH THE MORNING OF SURGERY WITH YOUR REGULAR TOOTHPASTE   Do NOT smoke  after Midnight   Take these  medicines the morning of surgery with A SIP OF WATER: N/A  DO NOT TAKE ANY ORAL DIABETIC MEDICATIONS DAY OF YOUR SURGERY                              You may not have any metal on your body including hair pins, jewelry, and body piercing             Do not wear lotions, powders, perfumes/cologne, or deodorant              Men may shave face and neck.   Do not bring valuables to the hospital. [facility] IS NOT             RESPONSIBLE   FOR VALUABLES.   Contacts, dentures or bridgework may not be worn into surgery.   Bring small overnight bag day of surgery.    Patients discharged on the day of surgery will not be allowed to drive home.  Someone needs to stay with you for the first 24 hours after anesthesia.   Special Instructions: Bring a copy of your healthcare power of attorney and living will documents         the day of surgery if you haven't scanned them before.              Please read over the following fact sheets you were given: IF YOU HAVE QUESTIONS ABOUT YOUR PRE-OP INSTRUCTIONS PLEASE CALL [phone]     [facility] - Preparing for Surgery Before surgery, you can play an important role.  Because skin is not sterile, your skin needs to be as free of germs as possible.  You can reduce the number of germs on your skin by washing with CHG (chlorahexidine gluconate) soap before surgery.  CHG is an antiseptic cleaner which kills germs and bonds with the skin to continue killing germs even after washing. Please DO NOT use if you have an allergy to CHG or antibacterial soaps.  If your skin becomes reddened/irritated stop using the CHG and inform your nurse when you arrive at Short Stay. Do not shave (including legs and underarms) for at least 48 hours prior to the first CHG shower.  You may shave your face/neck. Please follow these instructions carefully:  1.  Shower with CHG Soap the night before surgery and the  morning of Surgery.  2.  If you choose to wash your hair, wash your  hair first as usual with your  normal  shampoo.  3.  After you shampoo, rinse your hair and body thoroughly to remove the  shampoo.                           4.  Use CHG as you would any other liquid soap.  You can apply chg directly  to the skin and wash                       Gently with a scrungie or clean washcloth.  5.  Apply the CHG Soap to your body ONLY FROM THE NECK DOWN.   Do not use on face/ open                           Wound or open sores. Avoid contact with eyes, ears mouth and genitals (  private parts).                       Wash face,  Genitals (private parts) with your normal soap.             6.  Wash thoroughly, paying special attention to the area where your surgery  will be performed.  7.  Thoroughly rinse your body with warm water from the neck down.  8.  DO NOT shower/wash with your normal soap after using and rinsing off  the CHG Soap.                9.  Pat yourself dry with a clean towel.            10.  Wear clean pajamas.            11.  Place clean sheets on your bed the night of your first shower and do not  sleep with pets. Day of Surgery : Do not apply any lotions/deodorants the morning of surgery.  Please wear clean clothes to the hospital/surgery center.  FAILURE TO FOLLOW THESE INSTRUCTIONS MAY RESULT IN THE CANCELLATION OF YOUR SURGERY PATIENT SIGNATURE_________________________________  NURSE SIGNATURE__________________________________  ________________________________________________________________________   Brian Oliver  An incentive spirometer is a tool that can help keep your lungs clear and active. This tool measures how well you are filling your lungs with each breath. Taking long deep breaths may help reverse or decrease the chance of developing breathing (pulmonary) problems (especially infection) following: A long period of time when you are unable to move or be active. BEFORE THE PROCEDURE  If the spirometer includes an indicator to  show your best effort, your nurse or respiratory therapist will set it to a desired goal. If possible, sit up straight or lean slightly forward. Try not to slouch. Hold the incentive spirometer in an upright position. INSTRUCTIONS FOR USE  Sit on the edge of your bed if possible, or sit up as far as you can in bed or on a chair. Hold the incentive spirometer in an upright position. Breathe out normally. Place the mouthpiece in your mouth and seal your lips tightly around it. Breathe in slowly and as deeply as possible, raising the piston or the ball toward the top of the column. Hold your breath for 3-5 seconds or for as long as possible. Allow the piston or ball to fall to the bottom of the column. Remove the mouthpiece from your mouth and breathe out normally. Rest for a few seconds and repeat Steps 1 through 7 at least 10 times every 1-2 hours when you are awake. Take your time and take a few normal breaths between deep breaths. The spirometer may include an indicator to show your best effort. Use the indicator as a goal to work toward during each repetition. After each set of 10 deep breaths, practice coughing to be sure your lungs are clear. If you have an incision (the cut made at the time of surgery), support your incision when coughing by placing a pillow or rolled up towels firmly against it. Once you are able to get out of bed, walk around indoors and cough well. You may stop using the incentive spirometer when instructed by your caregiver.  RISKS AND COMPLICATIONS Take your time so you do not get dizzy or light-headed. If you are in pain, you may need to take or ask for pain medication before doing incentive spirometry. It is harder to take  a deep breath if you are having pain. AFTER USE Rest and breathe slowly and easily. It can be helpful to keep track of a log of your progress. Your caregiver can provide you with a simple table to help with this. If you are using the spirometer at  home, follow these instructions: SEEK MEDICAL CARE IF:  You are having difficultly using the spirometer. You have trouble using the spirometer as often as instructed. Your pain medication is not giving enough relief while using the spirometer. You develop fever of 100.5 F (38.1 C) or higher. SEEK IMMEDIATE MEDICAL CARE IF:  You cough up bloody sputum that had not been present before. You develop fever of 102 F (38.9 C) or greater. You develop worsening pain at or near the incision site. MAKE SURE YOU:  Understand these instructions. Will watch your condition. Will get help right away if you are not doing well or get worse. Document Released: 05/06/2006 Document Revised: 03/18/2011 Document Reviewed: 07/07/2006 [facility] Patient Information 2014 [employer] [place].   ________________________________________________________________________

## 2021-02-23 ENCOUNTER — Encounter (HOSPITAL_COMMUNITY)
Admission: RE | Admit: 2021-02-23 | Discharge: 2021-02-23 | Disposition: A | Discharge status: Home / Self Care | Payer: 59 | Source: Ambulatory Visit | Attending: Orthopaedic Surgery | Admitting: Orthopaedic Surgery

## 2021-02-23 ENCOUNTER — Encounter (HOSPITAL_COMMUNITY): Payer: Self-pay

## 2021-02-23 ENCOUNTER — Other Ambulatory Visit: Payer: Self-pay

## 2021-02-23 VITALS — BP 147/73 | HR 78 | Temp 98.2°F | Ht 66.0 in | Wt 205.0 lb

## 2021-02-23 DIAGNOSIS — M1711 Unilateral primary osteoarthritis, right knee: Secondary | ICD-10-CM | POA: Insufficient documentation

## 2021-02-23 DIAGNOSIS — R7303 Prediabetes: Secondary | ICD-10-CM | POA: Diagnosis not present

## 2021-02-23 DIAGNOSIS — Z01812 Encounter for preprocedural laboratory examination: Secondary | ICD-10-CM | POA: Diagnosis not present

## 2021-02-23 DIAGNOSIS — Z01818 Encounter for other preprocedural examination: Secondary | ICD-10-CM

## 2021-02-23 HISTORY — DX: Unspecified viral hepatitis C without hepatic coma: B19.20

## 2021-02-23 HISTORY — DX: Prediabetes: R73.03

## 2021-02-23 LAB — CBC
HCT: 42.3 % (ref 39.0–52.0)
Hemoglobin: 14.2 g/dL (ref 13.0–17.0)
MCH: 31.7 pg (ref 26.0–34.0)
MCHC: 33.6 g/dL (ref 30.0–36.0)
MCV: 94.4 fL (ref 80.0–100.0)
Platelets: 303 10*3/uL (ref 150–400)
RBC: 4.48 MIL/uL (ref 4.22–5.81)
RDW: 14.8 % (ref 11.5–15.5)
WBC: 8.1 10*3/uL (ref 4.0–10.5)
nRBC: 0 % (ref 0.0–0.2)

## 2021-02-23 LAB — HEMOGLOBIN A1C
Hgb A1c MFr Bld: 5.4 % (ref 4.8–5.6)
Mean Plasma Glucose: 108.28 mg/dL

## 2021-02-23 LAB — SURGICAL PCR SCREEN
MRSA, PCR: NEGATIVE
Staphylococcus aureus: NEGATIVE

## 2021-02-23 LAB — GLUCOSE, CAPILLARY: Glucose-Capillary: 100 mg/dL — ABNORMAL HIGH (ref 70–99)

## 2021-02-23 NOTE — Progress Notes (Signed)
COVID Vaccine Completed: Yes Date COVID Vaccine completed: 2022 x 3 COVID vaccine manufacturer:  Moderna    COVID Test: 02/28/21 Bowel prep reminder: N/A  PCP - Dr. Ceasar Mons Peru Cardiologist -   Chest x-ray -  EKG -  Stress Test -  ECHO -  Cardiac Cath -  Pacemaker/ICD device last checked:  Sleep Study -  CPAP -   Fasting Blood Sugar - N/A Checks Blood Sugar __0___ times a day  Blood Thinner Instructions: Aspirin Instructions: Last Dose:  Anesthesia review: Hx: Pre-DIA.  Patient denies shortness of breath, fever, cough and chest pain at PAT appointment   Patient verbalized understanding of instructions that were given to them at the PAT appointment. Patient was also instructed that they will need to review over the PAT instructions again at home before surgery.

## 2021-02-27 DIAGNOSIS — Z6830 Body mass index (BMI) 30.0-30.9, adult: Secondary | ICD-10-CM | POA: Diagnosis not present

## 2021-02-27 DIAGNOSIS — Z20822 Contact with and (suspected) exposure to covid-19: Secondary | ICD-10-CM | POA: Diagnosis not present

## 2021-02-27 DIAGNOSIS — R7303 Prediabetes: Secondary | ICD-10-CM | POA: Diagnosis not present

## 2021-02-27 DIAGNOSIS — E669 Obesity, unspecified: Secondary | ICD-10-CM | POA: Diagnosis not present

## 2021-02-27 DIAGNOSIS — Z87891 Personal history of nicotine dependence: Secondary | ICD-10-CM | POA: Diagnosis not present

## 2021-02-27 DIAGNOSIS — M1711 Unilateral primary osteoarthritis, right knee: Secondary | ICD-10-CM | POA: Diagnosis present

## 2021-02-28 ENCOUNTER — Encounter (HOSPITAL_COMMUNITY)
Admission: RE | Admit: 2021-02-28 | Discharge: 2021-02-28 | Disposition: A | Discharge status: Home / Self Care | Payer: 59 | Source: Ambulatory Visit | Attending: Orthopaedic Surgery | Admitting: Orthopaedic Surgery

## 2021-02-28 ENCOUNTER — Other Ambulatory Visit: Payer: Self-pay

## 2021-02-28 LAB — SARS CORONAVIRUS 2 (TAT 6-24 HRS): SARS Coronavirus 2: NEGATIVE

## 2021-03-01 DIAGNOSIS — M1711 Unilateral primary osteoarthritis, right knee: Secondary | ICD-10-CM

## 2021-03-01 NOTE — H&P (Signed)
TOTAL KNEE ADMISSION H&P  Patient is being admitted for right total knee arthroplasty.  Subjective:  Chief Complaint:right knee pain.  HPI: Brian Oliver, 66 y.o. male, has a history of pain and functional disability in the right knee due to arthritis and has failed non-surgical conservative treatments for greater than 12 weeks to includeNSAID's and/or analgesics, corticosteriod injections, flexibility and strengthening excercises, use of assistive devices, weight reduction as appropriate, and activity modification.  Onset of symptoms was gradual, starting 5 years ago with gradually worsening course since that time. The patient noted no past surgery on the right knee(s).  Patient currently rates pain in the right knee(s) at 10 out of 10 with activity. Patient has night pain, worsening of pain with activity and weight bearing, pain that interferes with activities of daily living, pain with passive range of motion, crepitus, and joint swelling.  Patient has evidence of subchondral sclerosis, periarticular osteophytes, and joint space narrowing by imaging studies. There is no active infection.  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   Current Outpatient Medications  Medication Sig Dispense Refill Last Dose   diclofenac (VOLTAREN) 75 MG EC tablet Take 1 tablet (75 mg total) by mouth 2 (two) times daily between meals as needed. 60 tablet 3    sodium chloride (AYR) 0.65 % nasal spray Place 1 spray into the nose as  needed for congestion.      No Known Allergies  Social History   Tobacco Use   Smoking status: Former    Packs/day: 1.50    Years: 10.00    Pack years: 15.00    Types: Cigarettes    Passive exposure: Never   Smokeless tobacco: Never  Substance Use Topics   Alcohol use: Yes    Alcohol/week: 1.0 standard drink    Types: 1 Cans of beer per week    Comment: occas.    Family History  Problem Relation Age of Onset   Hypertension Mother    Stroke Father      Review of Systems  Musculoskeletal:  Positive for gait problem and joint swelling.  All other systems reviewed and are negative.  Objective:  Physical Exam Vitals reviewed.  Constitutional:      Appearance: Normal appearance.  HENT:     Head: Normocephalic and atraumatic.  Eyes:     Extraocular Movements: Extraocular movements intact.     Pupils: Pupils are equal, round, and reactive to light.  Cardiovascular:     Rate and Rhythm: Normal rate and regular rhythm.     Pulses: Normal pulses.  Pulmonary:     Effort: Pulmonary effort is normal.     Breath sounds: Normal breath sounds.  Abdominal:     Palpations: Abdomen is soft.  Musculoskeletal:     Cervical back: Normal range of motion and neck supple.     Right knee: Effusion, bony tenderness and crepitus present. Decreased range of motion. Tenderness present over the medial joint line, lateral  joint line and patellar tendon. Abnormal meniscus.  Neurological:     Mental Status: He is oriented to person, place, and time.  Psychiatric:        Behavior: Behavior normal.    Vital signs in last 24 hours:    Labs:   Estimated body mass index is 33.09 kg/m as calculated from the following:   Height as of 02/23/21: 5\' 6"  (1.676 m).   Weight as of 02/23/21: 93 kg.   Imaging Review Plain radiographs demonstrate severe degenerative joint disease of the right knee(s). The overall alignment isneutral. The bone quality appears to be good for age and reported activity  level.      Assessment/Plan:  End stage arthritis, right knee   The patient history, physical examination, clinical judgment of the provider and imaging studies are consistent with end stage degenerative joint disease of the right knee(s) and total knee arthroplasty is deemed medically necessary. The treatment options including medical management, injection therapy arthroscopy and arthroplasty were discussed at length. The risks and benefits of total knee arthroplasty were presented and reviewed. The risks due to aseptic loosening, infection, stiffness, patella tracking problems, thromboembolic complications and other imponderables were discussed. The patient acknowledged the explanation, agreed to proceed with the plan and consent was signed. Patient is being admitted for inpatient treatment for surgery, pain control, PT, OT, prophylactic antibiotics, VTE prophylaxis, progressive ambulation and ADL's and discharge planning. The patient is planning to be discharged home with home health services

## 2021-03-02 ENCOUNTER — Ambulatory Visit (HOSPITAL_COMMUNITY): Payer: 59 | Admitting: Anesthesiology

## 2021-03-02 ENCOUNTER — Other Ambulatory Visit: Payer: Self-pay

## 2021-03-02 ENCOUNTER — Ambulatory Visit (HOSPITAL_BASED_OUTPATIENT_CLINIC_OR_DEPARTMENT_OTHER): Payer: 59 | Admitting: Anesthesiology

## 2021-03-02 ENCOUNTER — Encounter (HOSPITAL_COMMUNITY): Payer: Self-pay | Admitting: Orthopaedic Surgery

## 2021-03-02 ENCOUNTER — Observation Stay (HOSPITAL_COMMUNITY)
Admission: RE | Admit: 2021-03-02 | Discharge: 2021-03-03 | Disposition: A | Discharge status: Home Health | Payer: 59 | Source: Ambulatory Visit | Attending: Orthopaedic Surgery | Admitting: Orthopaedic Surgery

## 2021-03-02 ENCOUNTER — Encounter (HOSPITAL_COMMUNITY)
Admission: RE | Disposition: A | Discharge status: Home Health | Payer: Self-pay | Source: Ambulatory Visit | Attending: Orthopaedic Surgery

## 2021-03-02 ENCOUNTER — Observation Stay (HOSPITAL_COMMUNITY): Payer: 59

## 2021-03-02 DIAGNOSIS — Z20822 Contact with and (suspected) exposure to covid-19: Secondary | ICD-10-CM | POA: Insufficient documentation

## 2021-03-02 DIAGNOSIS — Z87891 Personal history of nicotine dependence: Secondary | ICD-10-CM | POA: Insufficient documentation

## 2021-03-02 DIAGNOSIS — R7303 Prediabetes: Secondary | ICD-10-CM | POA: Insufficient documentation

## 2021-03-02 DIAGNOSIS — Z01812 Encounter for preprocedural laboratory examination: Secondary | ICD-10-CM

## 2021-03-02 DIAGNOSIS — E669 Obesity, unspecified: Secondary | ICD-10-CM | POA: Insufficient documentation

## 2021-03-02 DIAGNOSIS — M1711 Unilateral primary osteoarthritis, right knee: Principal | ICD-10-CM

## 2021-03-02 DIAGNOSIS — Z96651 Presence of right artificial knee joint: Secondary | ICD-10-CM

## 2021-03-02 DIAGNOSIS — Z6830 Body mass index (BMI) 30.0-30.9, adult: Secondary | ICD-10-CM | POA: Insufficient documentation

## 2021-03-02 HISTORY — PX: TOTAL KNEE ARTHROPLASTY: SHX125

## 2021-03-02 LAB — TYPE AND SCREEN
ABO/RH(D): O POS
Antibody Screen: NEGATIVE

## 2021-03-02 LAB — GLUCOSE, CAPILLARY
Glucose-Capillary: 92 mg/dL (ref 70–99)
Glucose-Capillary: 92 mg/dL (ref 70–99)

## 2021-03-02 LAB — ABO/RH: ABO/RH(D): O POS

## 2021-03-02 SURGERY — ARTHROPLASTY, KNEE, TOTAL
Anesthesia: Spinal | Site: Knee | Laterality: Right

## 2021-03-02 MED ORDER — POVIDONE-IODINE 10 % EX SWAB
2.0000 "application " | Freq: Once | CUTANEOUS | Status: AC
  Administered 2021-03-02: 2 via TOPICAL

## 2021-03-02 MED ORDER — PHENYLEPHRINE 40 MCG/ML (10ML) SYRINGE FOR IV PUSH (FOR BLOOD PRESSURE SUPPORT)
PREFILLED_SYRINGE | INTRAVENOUS | Status: DC | PRN
  Administered 2021-03-02: 80 ug via INTRAVENOUS

## 2021-03-02 MED ORDER — OXYCODONE HCL 5 MG PO TABS
10.0000 mg | ORAL_TABLET | ORAL | Status: DC | PRN
  Administered 2021-03-02 – 2021-03-03 (×3): 15 mg via ORAL
  Filled 2021-03-02 (×3): qty 3

## 2021-03-02 MED ORDER — DEXAMETHASONE SODIUM PHOSPHATE 10 MG/ML IJ SOLN
INTRAMUSCULAR | Status: DC | PRN
Start: 2021-03-02 — End: 2021-03-02
  Administered 2021-03-02: 10 mg via INTRAVENOUS

## 2021-03-02 MED ORDER — ASPIRIN 81 MG PO CHEW
81.0000 mg | CHEWABLE_TABLET | Freq: Two times a day (BID) | ORAL | Status: DC
  Administered 2021-03-02 – 2021-03-03 (×2): 81 mg via ORAL
  Filled 2021-03-02 (×2): qty 1

## 2021-03-02 MED ORDER — SODIUM CHLORIDE 0.9 % IR SOLN
Status: DC | PRN
  Administered 2021-03-02: 1000 mL

## 2021-03-02 MED ORDER — 0.9 % SODIUM CHLORIDE (POUR BTL) OPTIME
TOPICAL | Status: DC | PRN
  Administered 2021-03-02: 1000 mL

## 2021-03-02 MED ORDER — STERILE WATER FOR IRRIGATION IR SOLN
Status: DC | PRN
  Administered 2021-03-02 (×2): 1000 mL

## 2021-03-02 MED ORDER — PANTOPRAZOLE SODIUM 40 MG PO TBEC
40.0000 mg | DELAYED_RELEASE_TABLET | Freq: Every day | ORAL | Status: DC
  Administered 2021-03-02 – 2021-03-03 (×2): 40 mg via ORAL
  Filled 2021-03-02 (×2): qty 1

## 2021-03-02 MED ORDER — METOCLOPRAMIDE HCL 5 MG PO TABS: 5.0000 mg | ORAL_TABLET | Freq: Three times a day (TID) | ORAL | Status: DC | PRN

## 2021-03-02 MED ORDER — PHENYLEPHRINE HCL-NACL 20-0.9 MG/250ML-% IV SOLN
INTRAVENOUS | Status: DC | PRN
  Administered 2021-03-02: 40 ug/min via INTRAVENOUS

## 2021-03-02 MED ORDER — ONDANSETRON HCL 4 MG PO TABS: 4.0000 mg | ORAL_TABLET | Freq: Four times a day (QID) | ORAL | Status: DC | PRN

## 2021-03-02 MED ORDER — MIDAZOLAM HCL 2 MG/2ML IJ SOLN
1.0000 mg | INTRAMUSCULAR | Status: DC
  Administered 2021-03-02: 1 mg via INTRAVENOUS
  Filled 2021-03-02: qty 2

## 2021-03-02 MED ORDER — SODIUM CHLORIDE 0.9 % IV SOLN: INTRAVENOUS | Status: DC

## 2021-03-02 MED ORDER — PROPOFOL 500 MG/50ML IV EMUL
INTRAVENOUS | Status: DC | PRN
  Administered 2021-03-02: 100 ug/kg/min via INTRAVENOUS

## 2021-03-02 MED ORDER — METOCLOPRAMIDE HCL 5 MG/ML IJ SOLN: 5.0000 mg | Freq: Three times a day (TID) | INTRAMUSCULAR | Status: DC | PRN

## 2021-03-02 MED ORDER — HYDROMORPHONE HCL 1 MG/ML IJ SOLN
0.5000 mg | INTRAMUSCULAR | Status: DC | PRN
  Administered 2021-03-02: 1 mg via INTRAVENOUS
  Filled 2021-03-02: qty 1

## 2021-03-02 MED ORDER — BUPIVACAINE IN DEXTROSE 0.75-8.25 % IT SOLN
INTRATHECAL | Status: DC | PRN
Start: 2021-03-02 — End: 2021-03-02
  Administered 2021-03-02: 1.7 mL via INTRATHECAL

## 2021-03-02 MED ORDER — ACETAMINOPHEN 500 MG PO TABS
1000.0000 mg | ORAL_TABLET | Freq: Once | ORAL | Status: AC
  Administered 2021-03-02: 1000 mg via ORAL
  Filled 2021-03-02: qty 2

## 2021-03-02 MED ORDER — OXYCODONE HCL 5 MG PO TABS
5.0000 mg | ORAL_TABLET | ORAL | Status: DC | PRN
  Administered 2021-03-03: 10 mg via ORAL
  Filled 2021-03-02: qty 2

## 2021-03-02 MED ORDER — LACTATED RINGERS IV SOLN: INTRAVENOUS | Status: DC

## 2021-03-02 MED ORDER — METHOCARBAMOL 1000 MG/10ML IJ SOLN
500.0000 mg | Freq: Four times a day (QID) | INTRAVENOUS | Status: DC | PRN
  Filled 2021-03-02: qty 5

## 2021-03-02 MED ORDER — PROPOFOL 10 MG/ML IV BOLUS
INTRAVENOUS | Status: DC | PRN
  Administered 2021-03-02: 30 mg via INTRAVENOUS

## 2021-03-02 MED ORDER — DIPHENHYDRAMINE HCL 12.5 MG/5ML PO ELIX: 12.5000 mg | ORAL_SOLUTION | ORAL | Status: DC | PRN

## 2021-03-02 MED ORDER — MENTHOL 3 MG MT LOZG: 1.0000 | LOZENGE | OROMUCOSAL | Status: DC | PRN

## 2021-03-02 MED ORDER — ALUM & MAG HYDROXIDE-SIMETH 200-200-20 MG/5ML PO SUSP: 30.0000 mL | ORAL | Status: DC | PRN

## 2021-03-02 MED ORDER — CHLORHEXIDINE GLUCONATE 0.12 % MT SOLN
15.0000 mL | Freq: Once | OROMUCOSAL | Status: AC
  Administered 2021-03-02: 15 mL via OROMUCOSAL

## 2021-03-02 MED ORDER — ROPIVACAINE HCL 5 MG/ML IJ SOLN
INTRAMUSCULAR | Status: DC | PRN
  Administered 2021-03-02: 30 mL via PERINEURAL

## 2021-03-02 MED ORDER — CEFAZOLIN SODIUM-DEXTROSE 2-4 GM/100ML-% IV SOLN
2.0000 g | INTRAVENOUS | Status: AC
  Administered 2021-03-02: 2 g via INTRAVENOUS
  Filled 2021-03-02: qty 100

## 2021-03-02 MED ORDER — ACETAMINOPHEN 325 MG PO TABS: 325.0000 mg | ORAL_TABLET | Freq: Four times a day (QID) | ORAL | Status: DC | PRN

## 2021-03-02 MED ORDER — PHENOL 1.4 % MT LIQD: 1.0000 | OROMUCOSAL | Status: DC | PRN

## 2021-03-02 MED ORDER — ORAL CARE MOUTH RINSE: 15.0000 mL | Freq: Once | OROMUCOSAL | Status: AC

## 2021-03-02 MED ORDER — CEFAZOLIN SODIUM-DEXTROSE 1-4 GM/50ML-% IV SOLN
1.0000 g | Freq: Four times a day (QID) | INTRAVENOUS | Status: AC
  Administered 2021-03-02 – 2021-03-03 (×2): 1 g via INTRAVENOUS
  Filled 2021-03-02 (×2): qty 50

## 2021-03-02 MED ORDER — DOCUSATE SODIUM 100 MG PO CAPS
100.0000 mg | ORAL_CAPSULE | Freq: Two times a day (BID) | ORAL | Status: DC
  Administered 2021-03-02 – 2021-03-03 (×2): 100 mg via ORAL
  Filled 2021-03-02 (×2): qty 1

## 2021-03-02 MED ORDER — ONDANSETRON HCL 4 MG/2ML IJ SOLN: 4.0000 mg | Freq: Four times a day (QID) | INTRAMUSCULAR | Status: DC | PRN

## 2021-03-02 MED ORDER — TRANEXAMIC ACID-NACL 1000-0.7 MG/100ML-% IV SOLN
1000.0000 mg | INTRAVENOUS | Status: AC
  Administered 2021-03-02: 1000 mg via INTRAVENOUS
  Filled 2021-03-02: qty 100

## 2021-03-02 MED ORDER — METHOCARBAMOL 500 MG PO TABS
500.0000 mg | ORAL_TABLET | Freq: Four times a day (QID) | ORAL | Status: DC | PRN
  Administered 2021-03-02 – 2021-03-03 (×3): 500 mg via ORAL
  Filled 2021-03-02 (×3): qty 1

## 2021-03-02 MED ORDER — FENTANYL CITRATE PF 50 MCG/ML IJ SOSY
50.0000 ug | PREFILLED_SYRINGE | INTRAMUSCULAR | Status: DC
  Administered 2021-03-02: 50 ug via INTRAVENOUS
  Filled 2021-03-02: qty 2

## 2021-03-02 SURGICAL SUPPLY — 53 items
BAG COUNTER SPONGE SURGICOUNT (BAG) IMPLANT
BAG ZIPLOCK 12X15 (MISCELLANEOUS) ×2 IMPLANT
BENZOIN TINCTURE PRP APPL 2/3 (GAUZE/BANDAGES/DRESSINGS) IMPLANT
BLADE SAG 18X100X1.27 (BLADE) ×2 IMPLANT
BLADE SURG SZ10 CARB STEEL (BLADE) ×4 IMPLANT
BNDG ELASTIC 6X5.8 VLCR STR LF (GAUZE/BANDAGES/DRESSINGS) ×4 IMPLANT
BOWL SMART MIX CTS (DISPOSABLE) IMPLANT
COOLER ICEMAN CLASSIC (MISCELLANEOUS) ×2 IMPLANT
COVER SURGICAL LIGHT HANDLE (MISCELLANEOUS) ×2 IMPLANT
CUFF TOURN SGL QUICK 34 (TOURNIQUET CUFF) ×1
CUFF TRNQT CYL 34X4.125X (TOURNIQUET CUFF) ×1 IMPLANT
DRAPE INCISE IOBAN 66X45 STRL (DRAPES) ×2 IMPLANT
DRAPE U-SHAPE 47X51 STRL (DRAPES) ×2 IMPLANT
DRSG PAD ABDOMINAL 8X10 ST (GAUZE/BANDAGES/DRESSINGS) ×4 IMPLANT
DURAPREP 26ML APPLICATOR (WOUND CARE) ×2 IMPLANT
ELECT BLADE TIP CTD 4 INCH (ELECTRODE) ×2 IMPLANT
ELECT REM PT RETURN 15FT ADLT (MISCELLANEOUS) ×2 IMPLANT
FEMORAL POSTERIOR SZ5 RT (Femur) IMPLANT
GAUZE SPONGE 4X4 12PLY STRL (GAUZE/BANDAGES/DRESSINGS) ×2 IMPLANT
GAUZE XEROFORM 1X8 LF (GAUZE/BANDAGES/DRESSINGS) IMPLANT
GLOVE SRG 8 PF TXTR STRL LF DI (GLOVE) ×2 IMPLANT
GLOVE SURG ENC MOIS LTX SZ7.5 (GLOVE) ×2 IMPLANT
GLOVE SURG NEOPR MICRO LF SZ8 (GLOVE) ×2 IMPLANT
GLOVE SURG UNDER POLY LF SZ8 (GLOVE) ×2
GOWN STRL REUS W/TWL XL LVL3 (GOWN DISPOSABLE) ×4 IMPLANT
HANDPIECE INTERPULSE COAX TIP (DISPOSABLE) ×1
HOLDER FOLEY CATH W/STRAP (MISCELLANEOUS) IMPLANT
IMMOBILIZER KNEE 20 (SOFTGOODS) ×2
IMMOBILIZER KNEE 20 THIGH 36 (SOFTGOODS) ×1 IMPLANT
INSERT TIB PS SZ5 11 KNEE (Miscellaneous) ×1 IMPLANT
KIT TURNOVER KIT A (KITS) IMPLANT
KNEE PATELLA ASYMMETRIC 10X32 (Knees) ×1 IMPLANT
KNEE TIBIAL COMPONENT SZ5 (Knees) ×1 IMPLANT
NS IRRIG 1000ML POUR BTL (IV SOLUTION) ×2 IMPLANT
PACK TOTAL KNEE CUSTOM (KITS) ×2 IMPLANT
PAD COLD SHLDR WRAP-ON (PAD) ×2 IMPLANT
PADDING CAST COTTON 6X4 STRL (CAST SUPPLIES) ×4 IMPLANT
POSTERIOR FEMORAL SZ5 RT (Femur) ×2 IMPLANT
PROTECTOR NERVE ULNAR (MISCELLANEOUS) ×2 IMPLANT
SET HNDPC FAN SPRY TIP SCT (DISPOSABLE) ×1 IMPLANT
SET PAD KNEE POSITIONER (MISCELLANEOUS) ×2 IMPLANT
SPIKE FLUID TRANSFER (MISCELLANEOUS) IMPLANT
SPONGE T-LAP 18X18 ~~LOC~~+RFID (SPONGE) ×6 IMPLANT
STAPLER VISISTAT 35W (STAPLE) IMPLANT
STRIP CLOSURE SKIN 1/2X4 (GAUZE/BANDAGES/DRESSINGS) IMPLANT
SUT MNCRL AB 4-0 PS2 18 (SUTURE) IMPLANT
SUT VIC AB 0 CT1 27 (SUTURE) ×1
SUT VIC AB 0 CT1 27XBRD ANTBC (SUTURE) ×1 IMPLANT
SUT VIC AB 1 CT1 36 (SUTURE) ×4 IMPLANT
SUT VIC AB 2-0 CT1 27 (SUTURE) ×2
SUT VIC AB 2-0 CT1 TAPERPNT 27 (SUTURE) ×2 IMPLANT
TRAY FOLEY MTR SLVR 16FR STAT (SET/KITS/TRAYS/PACK) IMPLANT
WATER STERILE IRR 1000ML POUR (IV SOLUTION) ×4 IMPLANT

## 2021-03-02 NOTE — Anesthesia Postprocedure Evaluation (Signed)
Anesthesia Post Note  Patient: Brian Oliver  Procedure(s) Performed: Right TOTAL KNEE ARTHROPLASTY (Right: Knee)     Patient location during evaluation: PACU Anesthesia Type: Spinal Level of consciousness: oriented and awake and alert Pain management: pain level controlled Vital Signs Assessment: post-procedure vital signs reviewed and stable Respiratory status: spontaneous breathing, respiratory function stable and nonlabored ventilation Cardiovascular status: blood pressure returned to baseline and stable Postop Assessment: no headache, no backache, no apparent nausea or vomiting and spinal receding Anesthetic complications: no   No notable events documented.  Last Vitals:  Vitals:   03/02/21 1700 03/02/21 1716  BP: 134/60 139/66  Pulse: (!) 58 64  Resp: 14 16  Temp:    SpO2: 97% 100%    Last Pain:  Vitals:   03/02/21 1716  TempSrc:   PainSc: 0-No pain                 Lucretia Kern

## 2021-03-02 NOTE — Op Note (Addendum)
NAMEAYCE, PIETRZYK MEDICAL RECORD NO: [id number] ACCOUNT NO: [id number] DATE OF BIRTH: [birth year] FACILITY: [facility] LOCATION: [id number] PHYSICIAN: Vanita Panda. Magnus Ivan, MD  Operative Report   DATE OF PROCEDURE: 03/02/2021  PREOPERATIVE DIAGNOSES:  Primary osteoarthritis and degenerative joint disease, right knee.  POSTOPERATIVE DIAGNOSES:  Primary osteoarthritis and degenerative joint disease, right knee.  PROCEDURE:  Right total knee arthroplasty.  IMPLANTS:  Stryker Triathlon press-fit knee system with size 5 femur, size 5 tibial tray, 11 mm fixed bearing polyethylene insert, size 32 press-fit patellar button.  SURGEON:  Vanita Panda. Magnus Ivan, MD  ASSISTANT:  Rexene Edison, PA-C  ANESTHESIA: 1.  Right lower extremity adductor canal block. 2.  Spinal.  TOURNIQUET TIME:  Under one hour.  ANTIBIOTICS:  2 g IV Ancef.  ESTIMATED BLOOD LOSS:  100 mL.  COMPLICATIONS:  None.  INDICATIONS:  The patient is a 66 year old gentleman with debilitating arthritis involving his right knee that has been well documented.  He has tried and failed all forms of conservative treatment.  At this point, his right knee pain is daily and it is  detrimentally affecting his mobility, his quality of life and his activities of daily living to the point he does wish to proceed with a knee replacement. We agree with this as well.  We talked about the risk of acute blood loss anemia, nerve and vessel  injury, fracture, infection, DVT.  We talked about our goals being decreased pain, improve mobility and overall improve quality of life.  DESCRIPTION OF PROCEDURE:  After informed consent was obtained, appropriate right knee was marked.  Anesthesia obtained an adductor canal block in the holding room of the right lower extremity.  He was then brought to the operating room and sat up on the  operating table where spinal anesthesia was obtained.  He was then laid in supine position on the operating table, Foley  catheter was placed and a nonsterile tourniquet was placed around his upper right thigh.  His right thigh, knee, leg, and ankle were  prepped and draped with DuraPrep and sterile drapes including a sterile stockinette.  A timeout was called and he was identified as correct patient, correct right knee.  We then used Esmarch to wrap that leg and tourniquet was inflated to 300 mm of  pressure.  I then made a direct midline incision over the patella and carried this proximally and distally.  I dissected down to the knee joint and carried out a medial parapatellar arthrotomy, finding a very large joint effusion.  With the knee in a  flexed position, we removed osteophytes from all three compartments.  We removed remnants of ACL, PCL, medial and lateral meniscus.  We then used the extramedullary cutting guide for making our proximal tibia cut, correcting for varus and valgus and  neutral slope and making this cut to take 9 mm off the high side.  We made this cut without difficulty.  We then went to the femur and used an intramedullary cutting guide for our distal femoral cut, setting this for a right knee at 5 degrees externally  rotated for an 8 mm distal femoral cut.  We made this cut without difficulty, and brought the knee back down to full extension and with a 9 mm extension block had achieved full extension.  We then went back to the femur and put our femoral sizing guide  based off the epicondylar axis.  Based off of this, we chose a size 5 femur.  We put a 4-in-1 cutting  block for a size 5 femur, made our anterior and posterior cuts followed by our chamfer cuts.  We then made our femoral box cut for a size 5 femur.   Attention was then turned back to the tibia.  We chose a size 5 tibial tray for coverage, setting the rotation off the tibial tubercle and the femur.  He had such excellent bone quality, it was quite hard and we used press-fit implants.  We did a  press-fit keel punch for the tibial tray and  then trialed a size 5 tibia followed by trialing a size 5 right femur. We then placed a 9 mm fixed bearing polyethylene insert and we decided to go up to 11 insert and we were pleased with range of motion and  stability with a size 11 thickness polyethylene insert.  We then made our patellar cut and drilled three holes for a size 32 press-fit patellar button.  With all trial instrumentation in the knee, we put the knee through several cycles of motion.  We  were pleased with range of motion and stability.  We then removed all instrumentation from the knee and irrigated the knee with normal saline solution using pulsatile lavage.  We dried the knee real well and then with the knee in a flexed position,  placed our real press-fit Stryker tibial tray, size 5 followed by a real size 5 press-fit femur for a right knee.  We placed our 11 mm fixed bearing polyethylene insert and press fit our size 32 patellar button.  We were pleased with the range of motion  again and stability of the knee and assessing the fixation of the implants.  We then let the tourniquet down.  Hemostasis was obtained with electrocautery.  We closed the arthrotomy with interrupted #1 Vicryl suture followed by 0 Vicryl to close the deep  tissue and 2-0 Vicryl to close the subcutaneous tissue.  The skin was closed with staples.  A well-padded sterile dressing was applied.  He was taken to recovery room in stable condition with all final counts being correct.  No complications noted.  Of  note, Richardean Canal, PA-C did assist in entire case and assistance was crucial for facilitating all aspects of this case.  He assisted during opening, closing, retraction of tissues and mobilization of tissues and his assistance was required the entire time during the case to help facilitate this case for proper positioning of the implants.  His assistance was medically necessary.   NIK D: 03/02/2021 3:25:12 pm T: 03/02/2021 10:14:00 pm  JOB: [id number]/  [id number]

## 2021-03-02 NOTE — Plan of Care (Signed)
  Problem: Education: Goal: Knowledge of General Education information will improve Description: Including pain rating scale, medication(s)/side effects and non-pharmacologic comfort measures Outcome: Progressing   Problem: Activity: Goal: Risk for activity intolerance will decrease Outcome: Progressing   Problem: Pain Managment: Goal: General experience of comfort will improve Outcome: Progressing   

## 2021-03-02 NOTE — Progress Notes (Signed)
AssistedDr. Carolyn Witman with right, ultrasound guided, adductor canal block. Side rails up, monitors on throughout procedure. See vital signs in flow sheet. Tolerated Procedure well.  

## 2021-03-02 NOTE — Brief Op Note (Signed)
03/02/2021  3:26 PM  PATIENT:  Lorri Frederick  66 y.o. male  PRE-OPERATIVE DIAGNOSIS:  Osteoarthritis / Degenerative joint diease right knee  POST-OPERATIVE DIAGNOSIS:  Osteoarthritis / Degenerative joint diease right knee  PROCEDURE:  Procedure(s): Right TOTAL KNEE ARTHROPLASTY (Right)  SURGEON:  Surgeon(s) and Role:    Kathryne Hitch, MD - Primary  PHYSICIAN ASSISTANT:  Rexene Edison, PA-C  ANESTHESIA:   regional and spinal  COUNTS:  YES  TOURNIQUET:  * Missing tourniquet times found for documented tourniquets in log: [id number] *  DICTATION: .Other Dictation: Dictation Number [id number]  PLAN OF CARE: Admit for overnight observation  PATIENT DISPOSITION:  PACU - hemodynamically stable.   Delay start of Pharmacological VTE agent (>24hrs) due to surgical blood loss or risk of bleeding: no

## 2021-03-02 NOTE — Transfer of Care (Signed)
Immediate Anesthesia Transfer of Care Note  Patient: Brian Oliver  Procedure(s) Performed: Right TOTAL KNEE ARTHROPLASTY (Right: Knee)  Patient Location: PACU  Anesthesia Type:Spinal  Level of Consciousness: awake and patient cooperative  Airway & Oxygen Therapy: Patient Spontanous Breathing and Patient connected to face mask oxygen  Post-op Assessment: Report given to RN and Post -op Vital signs reviewed and stable  Post vital signs: stable  Last Vitals:  Vitals Value Taken Time  BP 116/61 03/02/21 1545  Temp    Pulse 64 03/02/21 1548  Resp 14 03/02/21 1548  SpO2 100 % 03/02/21 1548  Vitals shown include unvalidated device data.  Last Pain:  Vitals:   03/02/21 1330  TempSrc:   PainSc: 0-No pain      Patients Stated Pain Goal: 4 (03/02/21 1610)  Complications: No notable events documented.

## 2021-03-02 NOTE — Anesthesia Procedure Notes (Signed)
Anesthesia Regional Block: Adductor canal block   Pre-Anesthetic Checklist: , timeout performed,  Correct Patient, Correct Site, Correct Laterality,  Correct Procedure, Correct Position, site marked,  Risks and benefits discussed,  Surgical consent,  Pre-op evaluation,  At surgeon's request and post-op pain management  Laterality: Right  Prep: chloraprep       Needles:  Injection technique: Single-shot  Needle Type: Echogenic Stimulator Needle     Needle Length: 10cm  Needle Gauge: 20     Additional Needles:   Procedures:,,,, ultrasound used (permanent image in chart),,    Narrative:  Start time: 03/02/2021 1:28 PM End time: 03/02/2021 1:32 PM Injection made incrementally with aspirations every 5 mL.  Performed by: Personally  Anesthesiologist: Lucretia Kern, MD  Additional Notes: Standard monitors applied. Skin prepped. Good needle visualization with ultrasound. Injection made in 5cc increments with no resistance to injection. Patient tolerated the procedure well.

## 2021-03-02 NOTE — Anesthesia Preprocedure Evaluation (Signed)
Anesthesia Evaluation  Patient identified by MRN, date of birth, ID band Patient awake    Reviewed: Allergy & Precautions, NPO status , Patient's Chart, lab work & pertinent test results  History of Anesthesia Complications Negative for: history of anesthetic complications  Airway Mallampati: II  TM Distance: >3 FB Neck ROM: Full    Dental   Pulmonary neg pulmonary ROS, former smoker,    Pulmonary exam normal        Cardiovascular negative cardio ROS Normal cardiovascular exam     Neuro/Psych negative neurological ROS     GI/Hepatic negative GI ROS, (+) Hepatitis -, C  Endo/Other  negative endocrine ROSPreDM  Renal/GU negative Renal ROS  negative genitourinary   Musculoskeletal  (+) Arthritis , Osteoarthritis,    Abdominal   Peds  Hematology negative hematology ROS (+)   Anesthesia Other Findings   Reproductive/Obstetrics                             Anesthesia Physical Anesthesia Plan  ASA: 2  Anesthesia Plan: Spinal   Post-op Pain Management: Regional block*, Tylenol PO (pre-op)* and Toradol IV (intra-op)*   Induction:   PONV Risk Score and Plan: 1 and Propofol infusion, Treatment may vary due to age or medical condition, Ondansetron and TIVA  Airway Management Planned: Nasal Cannula and Simple Face Mask  Additional Equipment: None  Intra-op Plan:   Post-operative Plan:   Informed Consent: I have reviewed the patients History and Physical, chart, labs and discussed the procedure including the risks, benefits and alternatives for the proposed anesthesia with the patient or authorized representative who has indicated his/her understanding and acceptance.       Plan Discussed with:   Anesthesia Plan Comments:         Anesthesia Quick Evaluation

## 2021-03-02 NOTE — Interval H&P Note (Signed)
History and Physical Interval Note: The patient is here today for a right total knee replacement to treat his severe right knee osteoarthritis.  He understands that fully.  This is been explained to the interpreter as well.  There is been no acute or interval change in his medical status.  Please see H&P.  The risks and benefits of surgery been explained in detail and informed consent is obtained.  The right operative knee has been marked.  03/02/2021 10:37 AM  Brian Oliver  has presented today for surgery, with the diagnosis of Osteoarthritis / Degenerative joint diease right knee.  The various methods of treatment have been discussed with the patient and family. After consideration of risks, benefits and other options for treatment, the patient has consented to  Procedure(s): Right TOTAL KNEE ARTHROPLASTY (Right) as a surgical intervention.  The patient's history has been reviewed, patient examined, no change in status, stable for surgery.  I have reviewed the patient's chart and labs.  Questions were answered to the patient's satisfaction.     Kathryne Hitch

## 2021-03-02 NOTE — Anesthesia Procedure Notes (Signed)
Spinal  Patient location during procedure: OR Start time: 03/02/2021 2:03 PM End time: 03/02/2021 2:06 PM Reason for block: surgical anesthesia Staffing Performed: resident/CRNA  Resident/CRNA: [place] Lavi Sheehan C, CRNA Preanesthetic Checklist Completed: patient identified, IV checked, site marked, risks and benefits discussed, surgical consent, monitors and equipment checked, pre-op evaluation and timeout performed Spinal Block Patient position: sitting Prep: DuraPrep and site prepped and draped Patient monitoring: heart rate, cardiac monitor, continuous pulse ox and blood pressure Approach: midline Location: L3-4 Injection technique: single-shot Needle Needle type: Pencan  Needle gauge: 24 G Needle length: 9 cm Assessment Sensory level: T4 Events: CSF return Additional Notes IV functioning, monitors applied to pt. Expiration date of kit checked and confirmed to be in date. Sterile prep and drape, hand hygiene and sterile gloved used. Pt was positioned and spine was prepped in sterile fashion. Skin was anesthetized with lidocaine. Free flow of clear CSF obtained prior to injecting local anesthetic into CSF x 1 attempt. Spinal needle aspirated freely following injection. Needle was carefully withdrawn, and pt tolerated procedure well. Loss of motor and sensory on exam post injection.

## 2021-03-03 DIAGNOSIS — M1711 Unilateral primary osteoarthritis, right knee: Secondary | ICD-10-CM | POA: Diagnosis not present

## 2021-03-03 LAB — BASIC METABOLIC PANEL
Anion gap: 8 (ref 5–15)
BUN: 16 mg/dL (ref 8–23)
CO2: 24 mmol/L (ref 22–32)
Calcium: 8 mg/dL — ABNORMAL LOW (ref 8.9–10.3)
Chloride: 100 mmol/L (ref 98–111)
Creatinine, Ser: 1.14 mg/dL (ref 0.61–1.24)
GFR, Estimated: >60 mL/min (ref >60)
Glucose, Bld: 158 mg/dL — ABNORMAL HIGH (ref 70–99)
Potassium: 4 mmol/L (ref 3.5–5.1)
Sodium: 132 mmol/L — ABNORMAL LOW (ref 135–145)

## 2021-03-03 LAB — CBC
HCT: 36.8 % — ABNORMAL LOW (ref 39.0–52.0)
Hemoglobin: 12.2 g/dL — ABNORMAL LOW (ref 13.0–17.0)
MCH: 31.4 pg (ref 26.0–34.0)
MCHC: 33.2 g/dL (ref 30.0–36.0)
MCV: 94.6 fL (ref 80.0–100.0)
Platelets: 253 10*3/uL (ref 150–400)
RBC: 3.89 MIL/uL — ABNORMAL LOW (ref 4.22–5.81)
RDW: 14.8 % (ref 11.5–15.5)
WBC: 10.2 10*3/uL (ref 4.0–10.5)
nRBC: 0 % (ref 0.0–0.2)

## 2021-03-03 MED ORDER — ASPIRIN 81 MG PO CHEW: 81.0000 mg | CHEWABLE_TABLET | Freq: Two times a day (BID) | ORAL | 0 refills | Status: DC

## 2021-03-03 MED ORDER — METHOCARBAMOL 500 MG PO TABS
500.0000 mg | ORAL_TABLET | Freq: Four times a day (QID) | ORAL | 1 refills | Status: DC | PRN
Start: 2021-03-03 — End: 2021-03-15

## 2021-03-03 MED ORDER — OXYCODONE HCL 5 MG PO TABS: 5.0000 mg | ORAL_TABLET | Freq: Four times a day (QID) | ORAL | 0 refills | Status: DC | PRN

## 2021-03-03 NOTE — Progress Notes (Addendum)
Physical Therapy Treatment Patient Details Name: Brian Oliver MRN: [id number] DOB: 1955-02-18 Today's Date: 03/03/2021   History of Present Illness 66 yo male s/p  RTKA 03/02/21. Hx of Hep C. Spanish Speaking-requires translation    PT Comments    2nd session to practice stair negotiation. Wife present to observe and practice as well. Issued stair negotiation handout (in [place]). Instructed pt to try to get up and walk every hour that he is awake. Reviewed use of ice pack cooler. Reviewed car transfer technique. All education completed.  Alina (Tree surgeon) present to aid with translation during session.     Recommendations for follow up therapy are one component of a multi-disciplinary discharge planning process, led by the attending physician.  Recommendations may be updated based on patient status, additional functional criteria and insurance authorization.  Follow Up Recommendations  Follow physician's recommendations for discharge plan and follow up therapies (plan is for HHPT)     Assistance Recommended at Discharge Frequent or constant Supervision/Assistance  Patient can return home with the following A little help with walking and/or transfers;A little help with bathing/dressing/bathroom;Help with stairs or ramp for entrance;Assist for transportation   Equipment Recommendations  Rolling walker (2 wheels)    Recommendations for Other Services       Precautions / Restrictions Precautions Precautions: Fall;Knee Precaution Comments: instructed pt to try to keep knee straight when at rest (in recliner/in bed) Restrictions Weight Bearing Restrictions: No Other Position/Activity Restrictions: WBAT     Mobility  Bed Mobility Overal bed mobility: Needs Assistance Bed Mobility: Supine to Sit, Sit to Supine     Supine to sit: Supervision, HOB elevated Sit to supine: Supervision, HOB elevated   General bed mobility comments: Supv for safety. Pt used gait belt  to assist R LE onto bed.    Transfers Overall transfer level: Needs assistance Equipment used: Rolling walker (2 wheels) Transfers: Sit to/from Stand Sit to Stand: Min guard           General transfer comment: Cues for safety, technique, hand/LE placement    Ambulation/Gait Ambulation/Gait assistance: Min guard Gait Distance (Feet): 115 Feet Assistive device: Rolling walker (2 wheels) Gait Pattern/deviations: Step-to pattern       General Gait Details: Min guard for safety. Pt denied dizziness. Tolerated distance well.   Stairs Stairs: Yes   Stair Management: Forwards, With walker, Step to pattern Number of Stairs: 6 General stair comments: Practiced negotiating gym stairs x 2. Cues for safety, technique, sequence. Wife present to practice with pt (stabilize walker)   Wheelchair Mobility    Modified Rankin (Stroke Patients Only)       Balance Overall balance assessment: Needs assistance         Standing balance support: Bilateral upper extremity supported, During functional activity Standing balance-Leahy Scale: Fair                              Cognition Arousal/Alertness: Awake/alert Behavior During Therapy: WFL for tasks assessed/performed Overall Cognitive Status: Within Functional Limits for tasks assessed                                          Exercises Total Joint Exercises Ankle Circles/Pumps: AROM, Both, 10 reps Quad Sets: AROM, Right, 5 reps Straight Leg Raises: AROM, Right, 5 reps Knee Flexion: AAROM, Right, 5 reps, Seated Goniometric  ROM: ~10-65 degrees (limited by pain)    General Comments        Pertinent Vitals/Pain Pain Assessment Pain Assessment: 0-10 Pain Score: 6  Pain Location: R knee with activity Pain Descriptors / Indicators: Discomfort, Sore Pain Intervention(s): Limited activity within patient's tolerance, Monitored during session, Ice applied, Repositioned    Home Living  Family/patient expects to be discharged to:: Private residence Living Arrangements: Spouse/significant other Available Help at Discharge: Family Type of Home: Apartment Home Access: Stairs to enter Entrance Stairs-Rails: None Entrance Stairs-Number of Steps: 4 up, 7 down   Home Layout: One level Home Equipment: None      Prior Function            PT Goals (current goals can now be found in the care plan section) Acute Rehab PT Goals Patient Stated Goal: home. regain PLOF PT Goal Formulation: With patient Time For Goal Achievement: 03/17/21 Potential to Achieve Goals: Good Progress towards PT goals: Progressing toward goals    Frequency    7X/week      PT Plan Current plan remains appropriate    Co-evaluation              AM-PAC PT "6 Clicks" Mobility   Outcome Measure  Help needed turning from your back to your side while in a flat bed without using bedrails?: A Little Help needed moving from lying on your back to sitting on the side of a flat bed without using bedrails?: A Little Help needed moving to and from a bed to a chair (including a wheelchair)?: A Little Help needed standing up from a chair using your arms (e.g., wheelchair or bedside chair)?: A Little Help needed to walk in hospital room?: A Little Help needed climbing 3-5 steps with a railing? : A Little 6 Click Score: 18    End of Session Equipment Utilized During Treatment: Gait belt Activity Tolerance: Patient tolerated treatment well Patient left: in bed;with call bell/phone within reach;with family/visitor present   PT Visit Diagnosis: Pain;Other abnormalities of gait and mobility (R26.89) Pain - Right/Left: Right Pain - part of body: Knee     Time: 7408-1448 PT Time Calculation (min) (ACUTE ONLY): 36 min  Charges:  $Gait Training: 23-37 mins                        Faye Ramsay, PT Acute Rehabilitation  Office: [phone] Pager: [phone]

## 2021-03-03 NOTE — Discharge Instructions (Signed)

## 2021-03-03 NOTE — Plan of Care (Signed)
Care plans completed

## 2021-03-03 NOTE — Discharge Summary (Signed)
Patient ID: Brian Oliver MRN: [id number] DOB/AGE: 06/12/1955 66 y.o.  Admit date: 03/02/2021 Discharge date: 03/03/2021  Admission Diagnoses:  Principal Problem:   Arthritis of right knee Active Problems:   Status post total right knee replacement   Discharge Diagnoses:  Same  Past Medical History:  Diagnosis Date   Hepatitis C    Hyperlipidemia    Pre-diabetes     Surgeries: Procedure(s): Right TOTAL KNEE ARTHROPLASTY on 03/02/2021   Consultants:   Discharged Condition: Improved  Hospital Course: Brian Oliver is an 66 y.o. male who was admitted 03/02/2021 for operative treatment ofArthritis of right knee. Patient has severe unremitting pain that affects sleep, daily activities, and work/hobbies. After pre-op clearance the patient was taken to the operating room on 03/02/2021 and underwent  Procedure(s): Right TOTAL KNEE ARTHROPLASTY.    Patient was given perioperative antibiotics:  Anti-infectives (From admission, onward)    Start     Dose/Rate Route Frequency Ordered Stop   03/02/21 2000  ceFAZolin (ANCEF) IVPB 1 g/50 mL premix        1 g 100 mL/hr over 30 Minutes Intravenous Every 6 hours 03/02/21 1734 03/03/21 0244   03/02/21 0930  ceFAZolin (ANCEF) IVPB 2g/100 mL premix        2 g 200 mL/hr over 30 Minutes Intravenous On call to O.R. 03/02/21 6767 03/02/21 1407        Patient was given sequential compression devices, early ambulation, and chemoprophylaxis to prevent DVT.  Patient benefited maximally from hospital stay and there were no complications.    Recent vital signs: Patient Vitals for the past 24 hrs:  BP Temp Temp src Pulse Resp SpO2  03/03/21 0518 125/65 98.9 F (37.2 C) Oral 81 18 95 %  03/03/21 0145 137/72 98.1 F (36.7 C) Oral 90 18 96 %  03/02/21 2014 129/67 98 F (36.7 C) Oral 85 15 96 %  03/02/21 1859 130/71 98.4 F (36.9 C) Oral 89 16 96 %  03/02/21 1716 139/66 -- -- 64 16 100 %  03/02/21 1700 134/60 -- -- (!) 58 14 97 %   03/02/21 1645 134/72 -- -- 60 18 100 %  03/02/21 1630 129/70 -- -- (!) 57 10 98 %  03/02/21 1615 128/68 -- -- 63 16 98 %  03/02/21 1600 125/71 -- -- 61 14 97 %  03/02/21 1545 116/61 98.1 F (36.7 C) -- 61 12 100 %  03/02/21 1330 (!) 145/71 -- -- 65 11 99 %  03/02/21 1325 (!) 153/79 -- -- 64 15 100 %     Recent laboratory studies:  Recent Labs    03/03/21 0255  WBC 10.2  HGB 12.2*  HCT 36.8*  PLT 253  NA 132*  K 4.0  CL 100  CO2 24  BUN 16  CREATININE 1.14  GLUCOSE 158*  CALCIUM 8.0*     Discharge Medications:   Allergies as of 03/03/2021   No Known Allergies      Medication List     TAKE these medications    aspirin 81 MG chewable tablet Chew 1 tablet (81 mg total) by mouth 2 (two) times daily.   diclofenac 75 MG EC tablet Commonly known as: VOLTAREN Take 1 tablet (75 mg total) by mouth 2 (two) times daily between meals as needed.   methocarbamol 500 MG tablet Commonly known as: ROBAXIN Take 1 tablet (500 mg total) by mouth every 6 (six) hours as needed for muscle spasms.   oxyCODONE 5 MG immediate release tablet Commonly  known as: Oxy IR/ROXICODONE Take 1-2 tablets (5-10 mg total) by mouth every 6 (six) hours as needed for moderate pain (pain score 4-6).   sodium chloride 0.65 % nasal spray Commonly known as: OCEAN Place 1 spray into the nose as needed for congestion.               Durable Medical Equipment  (From admission, onward)           Start     Ordered   03/02/21 1735  DME 3 n 1  Once        03/02/21 1734   03/02/21 1735  DME Walker rolling  Once       Question Answer Comment  Walker: With 5 Inch Wheels   Patient needs a walker to treat with the following condition Status post right knee replacement      03/02/21 1734            Diagnostic Studies: DG Knee Right Port  Result Date: 03/02/2021 CLINICAL DATA:  Status post knee arthroplasty EXAM: PORTABLE RIGHT KNEE - 1-2 VIEW COMPARISON:  Radiographs dated July 26, 2020 FINDINGS: Status post knee arthroplasty with postsurgical changes as expected. No periprosthetic fracture. IMPRESSION: Status post right knee arthroplasty with postsurgical changes as expected. Electronically Signed   By: Larose Hires D.O.   On: 03/02/2021 16:31    Disposition: Discharge disposition: 01-Home or Self Care       Discharge Instructions     Discharge patient   Complete by: As directed    Can discharge today if clears therapy.   Discharge disposition: 01-Home or Self Care   Discharge patient date: 03/03/2021        Follow-up Information     Kathryne Hitch, MD Follow up in 2 week(s).   Specialty: Orthopedic Surgery Contact information: [address] [place] [address] [phone]                  Signed: Kathryne Hitch 03/03/2021, 10:09 AM

## 2021-03-03 NOTE — Evaluation (Signed)
Physical Therapy Evaluation Patient Details Name: Brian Oliver MRN: [id number] DOB: 1955-07-26 Today's Date: 03/03/2021  History of Present Illness  66 yo male s/p  RTKA 03/02/21. Hx of Hep C. Spanish Speaking-requires translatio  Clinical Impression  On eval, pt required Min A for mobility. He walked ~75 feet with a RW. Minimal pain during session. Encouraged pt to take something for pain before next session so we can keep pain controlled-Rn aware. Will plan to attempt stair negotiation next session. If pt continues to do well and pain remains controlled, should be okay to d/c home today.        Recommendations for follow up therapy are one component of a multi-disciplinary discharge planning process, led by the attending physician.  Recommendations may be updated based on patient status, additional functional criteria and insurance authorization.  Follow Up Recommendations Follow physician's recommendations for discharge plan and follow up therapies (plan is for HHPT f/u)    Assistance Recommended at Discharge Frequent or constant Supervision/Assistance  Patient can return home with the following  A little help with walking and/or transfers;A little help with bathing/dressing/bathroom;Help with stairs or ramp for entrance;Assist for transportation    Equipment Recommendations Rolling walker (2 wheels)  Recommendations for Other Services       Functional Status Assessment Patient has had a recent decline in their functional status and demonstrates the ability to make significant improvements in function in a reasonable and predictable amount of time.     Precautions / Restrictions Precautions Precautions: Fall;Knee Restrictions Weight Bearing Restrictions: No Other Position/Activity Restrictions: WBAT      Mobility  Bed Mobility Overal bed mobility: Needs Assistance Bed Mobility: Supine to Sit     Supine to sit: Supervision     General bed mobility comments: Supv for  safety. Pt requested to perform task unassisted.    Transfers Overall transfer level: Needs assistance Equipment used: Rolling walker (2 wheels) Transfers: Sit to/from Stand Sit to Stand: Min guard           General transfer comment: Cues for safety, technique, hand/LE placement    Ambulation/Gait Ambulation/Gait assistance: Min guard Gait Distance (Feet): 75 Feet Assistive device: Rolling walker (2 wheels) Gait Pattern/deviations: Step-to pattern       General Gait Details: Min guard for safety. Pt denied dizziness. Tolerated distance well.  Stairs            Wheelchair Mobility    Modified Rankin (Stroke Patients Only)       Balance Overall balance assessment: Needs assistance         Standing balance support: Bilateral upper extremity supported, During functional activity Standing balance-Leahy Scale: Fair                               Pertinent Vitals/Pain Pain Assessment Pain Assessment: 0-10 Pain Score: 2  Pain Location: R knee with activity Pain Descriptors / Indicators: Discomfort, Sore Pain Intervention(s): Limited activity within patient's tolerance, Monitored during session, Repositioned, Ice applied    Home Living Family/patient expects to be discharged to:: Private residence Living Arrangements: Spouse/significant other Available Help at Discharge: Family Type of Home: Apartment Home Access: Stairs to enter Entrance Stairs-Rails: None Entrance Stairs-Number of Steps: 4 up, 7 down   Home Layout: One level Home Equipment: None      Prior Function Prior Level of Function : Independent/Modified Independent  Hand Dominance        Extremity/Trunk Assessment   Upper Extremity Assessment Upper Extremity Assessment: Overall WFL for tasks assessed    Lower Extremity Assessment Lower Extremity Assessment: Generalized weakness    Cervical / Trunk Assessment Cervical / Trunk Assessment:  Normal  Communication   Communication: No difficulties  Cognition Arousal/Alertness: Awake/alert Behavior During Therapy: WFL for tasks assessed/performed Overall Cognitive Status: Within Functional Limits for tasks assessed                                          General Comments      Exercises Total Joint Exercises Ankle Circles/Pumps: AROM, Both, 10 reps Quad Sets: AROM, Right, 5 reps Straight Leg Raises: AROM, Right, 5 reps Knee Flexion: AAROM, Right, 5 reps, Seated Goniometric ROM: ~10-65 degrees (limited by pain)   Assessment/Plan    PT Assessment Patient needs continued PT services  PT Problem List Decreased strength;Decreased mobility;Decreased range of motion;Decreased activity tolerance;Decreased balance;Decreased knowledge of use of DME;Pain       PT Treatment Interventions DME instruction;Gait training;Therapeutic exercise;Balance training;Stair training;Functional mobility training;Therapeutic activities;Patient/family education    PT Goals (Current goals can be found in the Care Plan section)  Acute Rehab PT Goals Patient Stated Goal: home. regain PLOF PT Goal Formulation: With patient Time For Goal Achievement: 03/17/21 Potential to Achieve Goals: Good    Frequency 7X/week     Co-evaluation               AM-PAC PT "6 Clicks" Mobility  Outcome Measure Help needed turning from your back to your side while in a flat bed without using bedrails?: A Little Help needed moving from lying on your back to sitting on the side of a flat bed without using bedrails?: A Little Help needed moving to and from a bed to a chair (including a wheelchair)?: A Little Help needed standing up from a chair using your arms (e.g., wheelchair or bedside chair)?: A Little Help needed to walk in hospital room?: A Little Help needed climbing 3-5 steps with a railing? : A Little 6 Click Score: 18    End of Session Equipment Utilized During Treatment: Gait  belt Activity Tolerance: Patient tolerated treatment well Patient left: in chair;with call bell/phone within reach;with family/visitor present   PT Visit Diagnosis: Pain;Other abnormalities of gait and mobility (R26.89) Pain - Right/Left: Right Pain - part of body: Knee    Time: 8295-6213 PT Time Calculation (min) (ACUTE ONLY): 26 min   Charges:   PT Evaluation $PT Eval Low Complexity: 1 Low PT Treatments $Gait Training: 8-22 mins           Faye Ramsay, PT Acute Rehabilitation  Office: [phone] Pager: [phone]

## 2021-03-03 NOTE — Plan of Care (Signed)
°  Problem: Education: Goal: Knowledge of General Education information will improve Description: Including pain rating scale, medication(s)/side effects and non-pharmacologic comfort measures 03/03/2021 0711 by Beverly Sessions, RN Outcome: Progressing 03/02/2021 1718 by Beverly Sessions, RN Outcome: Progressing   Problem: Activity: Goal: Risk for activity intolerance will decrease 03/03/2021 0711 by Beverly Sessions, RN Outcome: Progressing 03/02/2021 1718 by Beverly Sessions, RN Outcome: Progressing   Problem: Pain Managment: Goal: General experience of comfort will improve 03/03/2021 0711 by Beverly Sessions, RN Outcome: Progressing 03/02/2021 1718 by Beverly Sessions, RN Outcome: Progressing

## 2021-03-03 NOTE — Plan of Care (Signed)

## 2021-03-03 NOTE — Progress Notes (Signed)
Discharge instructions provided to patient in spanish. Family at bedside to translate any questions or concerns. None noted. Patient discharging with dme walker and commode

## 2021-03-03 NOTE — Progress Notes (Signed)
Subjective: 1 Day Post-Op Procedure(s) (LRB): Right TOTAL KNEE ARTHROPLASTY (Right) Patient reports pain as moderate.    Objective: Vital signs in last 24 hours: Temp:  [98 F (36.7 C)-98.9 F (37.2 C)] 98.9 F (37.2 C) (02/25 0518) Pulse Rate:  [57-90] 81 (02/25 0518) Resp:  [10-18] 18 (02/25 0518) BP: (116-153)/(60-79) 125/65 (02/25 0518) SpO2:  [95 %-100 %] 95 % (02/25 0518)  Intake/Output from previous day: 02/24 0701 - 02/25 0700 In: 2902.5 [P.O.:720; I.V.:1932.5; IV Piggyback:250] Out: 2075 [Urine:2075] Intake/Output this shift: Total I/O In: 300 [I.V.:300] Out: -   Recent Labs    03/03/21 0255  HGB 12.2*   Recent Labs    03/03/21 0255  WBC 10.2  RBC 3.89*  HCT 36.8*  PLT 253   Recent Labs    03/03/21 0255  NA 132*  K 4.0  CL 100  CO2 24  BUN 16  CREATININE 1.14  GLUCOSE 158*  CALCIUM 8.0*   No results for input(s): LABPT, INR in the last 72 hours.  Sensation intact distally Intact pulses distally Dorsiflexion/Plantar flexion intact Incision: dressing C/D/I No cellulitis present Compartment soft   Assessment/Plan: 1 Day Post-Op Procedure(s) (LRB): Right TOTAL KNEE ARTHROPLASTY (Right) Up with therapy Discharge home with home health      Kathryne Hitch 03/03/2021, 10:07 AM

## 2021-03-03 NOTE — TOC Transition Note (Signed)
Transition of Care [facility]) - CM/SW Discharge Note   Patient Details  Name: Brian Oliver MRN: [id number] Date of Birth: [birth year]  Transition of Care [facility]) CM/SW Contact:  Darleene Cleaver, LCSW Phone Number: 03/03/2021, 3:16 PM   Clinical Narrative:     Patient will be going home with home health through [facility].  CSW signing off please reconsult with any other social work needs, home health agency has been notified of planned discharge.  Patient needs a rolling walker and 3 in 1 which will be delivered to patient's room before he discharges from [facility].   Final next level of care: Home w Home Health Services Barriers to Discharge: Barriers Resolved   Patient Goals and CMS Choice Patient states their goals for this hospitalization and ongoing recovery are:: Plan to return back home with home health services. CMS Medicare.gov Compare Post Acute Care list provided to:: Patient Choice offered to / list presented to : Patient  Discharge Placement  Home with Home Health.                     Discharge Plan and Services                DME Arranged: Walker rolling, 3-N-1 DME Agency: [employer] Date DME Agency Contacted: 03/03/21 Time DME Agency Contacted: 1130 Representative spoke with at DME Agency: Vaughan Basta HH Arranged: PT HH Agency: [facility] Date [facility] Agency Contacted: 03/02/21 Time HH Agency Contacted: 1516 Representative spoke with at [facility] Agency: Stacie  Social Determinants of Health (SDOH) Interventions     Readmission Risk Interventions No flowsheet data found.

## 2021-03-04 ENCOUNTER — Encounter (HOSPITAL_COMMUNITY): Payer: Self-pay | Admitting: Orthopaedic Surgery

## 2021-03-12 ENCOUNTER — Other Ambulatory Visit: Payer: Self-pay | Admitting: Orthopaedic Surgery

## 2021-03-12 ENCOUNTER — Telehealth: Payer: Self-pay | Admitting: Orthopaedic Surgery

## 2021-03-12 MED ORDER — OXYCODONE HCL 5 MG PO TABS: 5.0000 mg | ORAL_TABLET | Freq: Four times a day (QID) | ORAL | 0 refills | Status: DC | PRN

## 2021-03-12 NOTE — Telephone Encounter (Signed)
Patient's son called advised patient is out of his pain medication. Patient uses [facility] on [address] in [place]   The number to contact Blossom Hoops is [phone] ?

## 2021-03-15 ENCOUNTER — Encounter: Payer: Self-pay | Admitting: Orthopaedic Surgery

## 2021-03-15 ENCOUNTER — Ambulatory Visit (INDEPENDENT_AMBULATORY_CARE_PROVIDER_SITE_OTHER): Payer: 59 | Admitting: Orthopaedic Surgery

## 2021-03-15 ENCOUNTER — Other Ambulatory Visit: Payer: Self-pay

## 2021-03-15 DIAGNOSIS — Z96651 Presence of right artificial knee joint: Secondary | ICD-10-CM

## 2021-03-15 MED ORDER — OXYCODONE HCL 5 MG PO TABS: 5.0000 mg | ORAL_TABLET | Freq: Four times a day (QID) | ORAL | 0 refills | Status: DC | PRN

## 2021-03-15 MED ORDER — METHOCARBAMOL 500 MG PO TABS: 500.0000 mg | ORAL_TABLET | Freq: Four times a day (QID) | ORAL | 1 refills | Status: DC | PRN

## 2021-03-15 NOTE — Addendum Note (Signed)
Addended by: Rogers Seeds on: 03/15/2021 05:12 PM ? ? Modules accepted: Orders ? ?

## 2021-03-15 NOTE — Progress Notes (Signed)
The patient is almost 2 weeks status post a right total knee arthroplasty.  He is with his son.  He reports that he is doing okay overall and has some improvement with range of motion and strength of that right knee.  He has home therapy coming to his house.  He understands we need to set him up for outpatient physical therapy. ? ?My exam his extension is almost full but his flexion is to about 80 degrees.  He is sore when I try to do this.  His right knee incision looks great so the staples are removed and Steri-Strips applied. ? ?We will send in more pain medication.  We will work on setting him up for outpatient therapy.  He can stop his aspirin.  I will see him back in 4 weeks to see how he is doing overall from mobility standpoint and range of motion standpoint but no x-rays are needed.  All questions and concerns were answered and addressed. ?

## 2021-03-19 NOTE — Therapy (Incomplete)
OUTPATIENT PHYSICAL THERAPY LOWER EXTREMITY EVALUATION   Patient Name: Brian Oliver MRN: [id number] DOB:09/21/1955, 66 y.o., male Today's Date: 03/19/2021    Past Medical History:  Diagnosis Date   Hepatitis C    Hyperlipidemia    Pre-diabetes    Past Surgical History:  Procedure Laterality Date   HERNIA REPAIR     TENDON REPAIR Left    TOTAL KNEE ARTHROPLASTY Right 03/02/2021   Procedure: Right TOTAL KNEE ARTHROPLASTY;  Surgeon: Kathryne Hitch, MD;  Location: [facility];  Service: Orthopedics;  Laterality: Right;   Patient Active Problem List   Diagnosis Date Noted   Status post total right knee replacement 03/02/2021   Arthritis of right knee 03/01/2021   Prediabetes 08/25/2020   Hyperlipidemia 08/08/2020   Obesity, Class I, BMI 30-34.9 08/08/2020   Effusion of right knee 08/08/2020   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  ONSET DATE: ***  SUBJECTIVE:   SUBJECTIVE STATEMENT: ***  PERTINENT HISTORY: ***  PAIN:  Are you having pain? {OPRCPAIN:27236}  PRECAUTIONS: {Therapy precautions:24002}  WEIGHT BEARING RESTRICTIONS {Yes ***/No:24003}  FALLS:  Has patient fallen in last 6 months? {yes/no:20286}, Number of falls: ***  LIVING ENVIRONMENT: Lives with: {[facility] lives with:25569::"lives with their family"} Lives in: {Lives in:25570} Stairs: {yes/no:20286}; {Stairs:24000} Has following equipment at home: {Assistive devices:23999}  OCCUPATION: ***  PLOF: {PLOF:24004}  PATIENT GOALS ***   OBJECTIVE:   DIAGNOSTIC FINDINGS: ***  PATIENT SURVEYS:  {rehab surveys:24030}  COGNITION:  Overall cognitive status: {cognition:24006}     SENSATION: {sensation:27233}  MUSCLE LENGTH: Hamstrings: Right *** deg; Left *** deg Thomas test: Right *** deg; Left *** deg  POSTURE:  ***  PALPATION: ***  LE  ROM:  {AROM/PROM:27142} ROM Right 03/19/2021 Left 03/19/2021  Hip flexion    Hip extension    Hip abduction    Hip adduction    Hip internal rotation    Hip external rotation    Knee flexion    Knee extension    Ankle dorsiflexion    Ankle plantarflexion    Ankle inversion    Ankle eversion     (Blank rows = not tested)  LE MMT:  MMT Right 03/19/2021 Left 03/19/2021  Hip flexion    Hip extension    Hip abduction    Hip adduction    Hip internal rotation    Hip external rotation    Knee flexion    Knee extension    Ankle dorsiflexion    Ankle plantarflexion    Ankle inversion    Ankle eversion     (Blank rows = not tested)  LOWER EXTREMITY SPECIAL TESTS:  {LEspecialtests:26242}  FUNCTIONAL TESTS:  {Functional tests:24029}  GAIT: Distance walked: *** Assistive device utilized: {Assistive devices:23999} Level of assistance: {Levels of assistance:24026} Comments: ***    TODAY'S TREATMENT: ***   PATIENT EDUCATION:  Education details: HEP, POC Person educated: Patient Education method: Programmer, multimedia, Demonstration, Verbal cues, and Handouts Education comprehension: verbalized understanding   HOME EXERCISE PROGRAM: Therex:  HEP instruction/performance c cues for techniques, handout provided.  Trial set performed of each for comprehension and symptom assessment.  See below for exercise list.  ASSESSMENT:  CLINICAL IMPRESSION: Patient is a 66 y.o. who comes to clinic with complaints of Rt knee pain s/p recent TKA on 03/02/2021 with mobility, strength and movement coordination deficits that impair their ability  to perform usual daily and recreational functional activities without increase difficulty/symptoms at this time.  Patient to benefit from skilled PT services to address impairments and limitations to improve to previous level of function without restriction secondary to condition.    OBJECTIVE IMPAIRMENTS {opptimpairments:25111}.   ACTIVITY LIMITATIONS  {activity limitations:25113}.   PERSONAL FACTORS {Personal factors:25162} are also affecting patient's functional outcome.    REHAB POTENTIAL: {rehabpotential:25112}  CLINICAL DECISION MAKING: {clinical decision making:25114}  EVALUATION COMPLEXITY: {Evaluation complexity:25115}   GOALS: Goals reviewed with patient? Yes  Short term PT Goals (target date for Short term goals are 3 weeks ***) Patient will demonstrate independent use of home exercise program to maintain progress from in clinic treatments. Goal status: New   Long term PT goals (target dates for all long term goals are 10 weeks  *** ) Patient will demonstrate/report pain at worst less than or equal to 2/10 to facilitate minimal limitation in daily activity secondary to pain symptoms. Goal status: New  Patient will demonstrate independent use of home exercise program to facilitate ability to maintain/progress functional gains from skilled physical therapy services. Goal status: New  Patient will demonstrate FOTO outcome > or = *** % to indicate reduced disability due to condition. Goal status: New  Patient will demonstrate Rt  knee AROM 0-110 degrees to facilitate ability to perform transfers, sitting, ambulation, stair navigation s restriction due to mobility. Goal status: New      5.  Patient will demonstrate Rt LE MMT 5/5 throughout to facilitate ability to perform usual standing, walking, stairs at PLOF s limitation due to symptoms.  Goal status: New      6.  ***  Goal status: New  7. *** a.  Goal Status: New   PLAN: PT FREQUENCY: 2x/week  PT DURATION: 10 weeks  PLANNED INTERVENTIONS: Therapeutic exercises, Therapeutic activity, Neuro Muscular re-education, Balance training, Gait training, Patient/Family education, Joint mobilization, Stair training, DME instructions, Dry Needling, Electrical stimulation, Cryotherapy, Moist heat, Taping, Ultrasound, Ionotophoresis 4mg /ml Dexamethasone, and Manual therapy.   All included unless contraindicated  PLAN FOR NEXT SESSION: ***  , PT, DPT, OCS, ATC 03/19/21  3:59 PM

## 2021-03-20 ENCOUNTER — Ambulatory Visit: Payer: 59 | Admitting: Rehabilitative and Restorative Service Providers"

## 2021-03-20 ENCOUNTER — Encounter: Payer: Self-pay | Admitting: Rehabilitative and Restorative Service Providers"

## 2021-03-20 ENCOUNTER — Other Ambulatory Visit: Payer: Self-pay

## 2021-03-20 DIAGNOSIS — R262 Difficulty in walking, not elsewhere classified: Secondary | ICD-10-CM | POA: Diagnosis not present

## 2021-03-20 DIAGNOSIS — R600 Localized edema: Secondary | ICD-10-CM

## 2021-03-20 DIAGNOSIS — M6281 Muscle weakness (generalized): Secondary | ICD-10-CM

## 2021-03-20 DIAGNOSIS — M25561 Pain in right knee: Secondary | ICD-10-CM

## 2021-03-20 DIAGNOSIS — M25661 Stiffness of right knee, not elsewhere classified: Secondary | ICD-10-CM

## 2021-03-20 DIAGNOSIS — G8929 Other chronic pain: Secondary | ICD-10-CM

## 2021-03-21 ENCOUNTER — Telehealth: Payer: Self-pay

## 2021-03-21 NOTE — Telephone Encounter (Signed)
FYI ? ?Victorino Dike from [facility] called and would like to let us know patient was d/c from HHPT and will start outpatient PT this week. ? ? ?CB [phone] ?

## 2021-03-23 ENCOUNTER — Encounter: Payer: Self-pay | Admitting: Rehabilitative and Restorative Service Providers"

## 2021-03-23 ENCOUNTER — Ambulatory Visit (INDEPENDENT_AMBULATORY_CARE_PROVIDER_SITE_OTHER): Payer: 59 | Admitting: Rehabilitative and Restorative Service Providers"

## 2021-03-23 ENCOUNTER — Other Ambulatory Visit: Payer: Self-pay

## 2021-03-23 DIAGNOSIS — R262 Difficulty in walking, not elsewhere classified: Secondary | ICD-10-CM | POA: Diagnosis not present

## 2021-03-23 DIAGNOSIS — M25561 Pain in right knee: Secondary | ICD-10-CM

## 2021-03-23 DIAGNOSIS — G8929 Other chronic pain: Secondary | ICD-10-CM

## 2021-03-23 DIAGNOSIS — M25661 Stiffness of right knee, not elsewhere classified: Secondary | ICD-10-CM | POA: Diagnosis not present

## 2021-03-23 DIAGNOSIS — R600 Localized edema: Secondary | ICD-10-CM | POA: Diagnosis not present

## 2021-03-23 DIAGNOSIS — M6281 Muscle weakness (generalized): Secondary | ICD-10-CM

## 2021-03-23 NOTE — Therapy (Signed)
?OUTPATIENT PHYSICAL THERAPY TREATMENT NOTE ? ? ?Patient Name: Brian Oliver ?MRN: [id number] ?DOB:11/12/1955, 66 y.o., male ?Today's Date: 03/23/2021 ? ?PCP: [place], Raymond J, MD ?REFERRING PROVIDER: Kathryne HitchBlackman, Christopher Y* ? ? PT End of Session - 03/23/21 1153   ? ? Visit Number 2   ? Number of Visits 20   ? Date for PT Re-Evaluation 05/29/21   ? Authorization Type Friday $20   ? Authorization - Number of Visits 30   ? Progress Note Due on Visit 10   ? PT Start Time 1147   ? PT Stop Time 1235   ? PT Time Calculation (min) 48 min   ? Activity Tolerance Patient tolerated treatment well   ? [name] assessed/performed   ? ?  ?  ? ?  ? ? ?Past Medical History:  ?Diagnosis Date  ? Hepatitis C   ? Hyperlipidemia   ? Pre-diabetes   ? ?Past Surgical History:  ?Procedure Laterality Date  ? HERNIA REPAIR    ? TENDON REPAIR Left   ? TOTAL KNEE ARTHROPLASTY Right 03/02/2021  ? Procedure: Right TOTAL KNEE ARTHROPLASTY;  Surgeon: Kathryne HitchBlackman, Christopher Y, MD;  Location: [facility];  Service: Orthopedics;  Laterality: Right;  ? ?Patient Active Problem List  ? Diagnosis Date Noted  ? Status post total right knee replacement 03/02/2021  ? Arthritis of right knee 03/01/2021  ? Prediabetes 08/25/2020  ? Hyperlipidemia 08/08/2020  ? Obesity, Class I, BMI 30-34.9 08/08/2020  ? Effusion of right knee 08/08/2020  ? 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 ? ?REFERRING DIAG: Z96.651 (ICD-10-CM) - Status post total right knee replacement ? ?THERAPY DIAG:  ?Difficulty in walking, not elsewhere classified ? ?Muscle weakness (generalized) ? ?Localized edema ? ?Stiffness of right knee, not elsewhere classified ? ?Chronic pain of right knee ? ?PERTINENT HISTORY: Hep C, Hyperlipidemia, pre-diabetes ? ?PRECAUTIONS: None ? ?SUBJECTIVE: Brian Oliver reports good early HEP compliance. ? ?PAIN:  ?Are you having pain? Yes: NPRS scale: 3-10/10 ?Pain location: Knee ?Pain description: R ?Aggravating factors: Achy and  tight ?Relieving factors: Ice and pain medication ? ? ?OBJECTIVE:  ?  ?PATIENT SURVEYS:  ?FOTO intake: 46  predicted: 66 ?  ?COGNITION: ?           Overall cognitive status: Within functional limits for tasks assessed              ?  ?MUSCLE LENGTH: ?03/20/2021: no specific muscle length testing today ?  ?Swelling/Incision ?Localized edema in Rt knee/leg.  Incision unremarkable today upon inspection with steristrips noted.  ?  ?PALPATION: ?Mild tenderness along incision Rt knee.  ?  ?LE ROM: ?  ?ROM Right ?03/20/2021 Left ?03/20/2021  ?Hip flexion      ?Hip extension      ?Hip abduction      ?Hip adduction      ?Hip internal rotation      ?Hip external rotation      ?Knee flexion 76 in supine heel slide AROM ?  ?83 in supine heel slide c passive overpressure    ?Knee extension -4 in seated LAQ AROM 0  ?Ankle dorsiflexion      ?Ankle plantarflexion      ?Ankle inversion      ?Ankle eversion      ? (Blank rows = not tested) ?  ?LE MMT: ?  ?MMT Right ?03/20/2021 Left ?03/20/2021  ?Hip flexion 4/5 5/5  ?Hip extension      ?Hip abduction      ?  Hip adduction      ?Hip internal rotation      ?Hip external rotation      ?Knee flexion 4/5 5/5  ?Knee extension 3+/5 5/5  ?Ankle dorsiflexion 5/5 5/5  ?Ankle plantarflexion      ?Ankle inversion      ?Ankle eversion      ? (Blank rows = not tested) ?  ?LOWER EXTREMITY SPECIAL TESTS:  ?03/20/2021: none performed ?  ?FUNCTIONAL TESTS:  ?03/20/2021: 20 inch table without UE c noted deviation to Lt LE WB. ?            Lt SLS: 20 seconds Rt: unable unassisted ?  ?GAIT: ?03/20/2021: ?Distance walked: Household distances < 150 ft  ?Assistive device utilized: FWW ?Level of assistance: Mod independent ?Comments: Reduced stance on Rt knee, mild limitation in TKE in stance, reduced step length c Lt foot ?  ?  ?  ?TODAY'S TREATMENT: ?03/23/2021 ?Seated quadriceps sets 20 total 5 seconds hold ? ?Seated knee flexion AAROM 20X total 10 seconds hold ? ?Seated LAQ 10X 5 seconds ? ?Seated knee extension  mobilization with weight 10# 1 minute ? ?Supine heel slide with belt 10X 10 seconds ? ?Recumbent bike Seat 6 AAROM for 8 minutes ? ?Functional Activities: Leg press full range stretch into flexion and extension (5 second hold) 20X 75# slow eccentrics (for sit to stand and stairs) ? ? ?03/20/2021: ?            Therex:            HEP instruction/performance c cues for techniques, handout provided.  Trial set performed of each for comprehension and symptom assessment.  See below for exercise list.  ?  ?  ?  ?PATIENT EDUCATION:  ?03/20/2021: ?Education details: HEP, POC ?Person educated: Patient ?Education method: Explanation, Demonstration, Verbal cues, and Handouts ?Education comprehension: verbalized understanding ?  ?  ?HOME EXERCISE PROGRAM: ?03/20/2021:  ?Access Code: [email or web address] ?URL: [email or web address] ?Date: 03/20/2021 ?Prepared by: Chyrel Masson ?  ?Exercises ?Seated Long Arc Quad - 3-5 x daily - 7 x weekly - 1 sets - 10 reps - 2 hold ?Supine Knee Extension Mobilization with Weight - 4 x daily - 7 x weekly - 1 sets - 1 reps - up to 15 mins hold ?Supine Heel Slide - 3-5 x daily - 7 x weekly - 1 sets - 10 reps ?Supine Heel Slide with Strap (Mirrored) - 3-5 x daily - 7 x weekly - 1 sets - 10 reps - 10 hold ?Seated Quad Set - 3-5 x daily - 7 x weekly - 1 sets - 10 reps - 5 hold ?Seated Knee Flexion Extension AAROM with Overpressure - 3-5 x daily - 7 x weekly - 1 sets - 5 reps - 10 hold ?  ?  ?ASSESSMENT: ?  ?CLINICAL IMPRESSION: ?Brian Oliver reports and demonstrates good early HEP compliance.  AROM will be checked weekly although extension AROM looked particularly good today.  His sleeping has been challenging since getting rid of his pillow.  Brian Oliver was given the OK to use the pillow with the knee at night as long as he can still get full extension during the day.  Continue HEP with emphasis on edema control, AROM and quadriceps strength. ?  ?  ?OBJECTIVE IMPAIRMENTS Abnormal gait, decreased activity  tolerance, decreased balance, decreased coordination, decreased endurance, decreased mobility, difficulty walking, decreased ROM, decreased strength, hypomobility, increased edema, impaired perceived functional ability, impaired flexibility, improper body mechanics, postural dysfunction, and pain.  ?  ?  ACTIVITY LIMITATIONS cleaning, community activity, driving, meal prep, occupation, and laundry.  ?  ?PERSONAL FACTORS  Hep C, Hyperlipidemia, pre-diabete  are also affecting patient's functional outcome.  ?  ?  ?REHAB POTENTIAL: Good ?  ?CLINICAL DECISION MAKING: Stable/uncomplicated ?  ?EVALUATION COMPLEXITY: Low ?  ?  ?GOALS: ?Goals reviewed with patient? Yes ?  ?Short term PT Goals (target date for Short term goals are 3 weeks 04/10/2021) ?Patient will demonstrate independent use of home exercise program to maintain progress from in clinic treatments. ?Goal status: New ?  ?Long term PT goals (target dates for all long term goals are 10 weeks  05/29/2021 ) ?Patient will demonstrate/report pain at worst less than or equal to 2/10 to facilitate minimal limitation in daily activity secondary to pain symptoms. ?Goal status: New ?  ?Patient will demonstrate independent use of home exercise program to facilitate ability to maintain/progress functional gains from skilled physical therapy services. ?Goal status: New ?  ?Patient will demonstrate FOTO outcome > or = 66 % to indicate reduced disability due to condition. ?Goal status: New ?  ?Patient will demonstrate Rt  knee AROM 0-110 degrees to facilitate ability to perform transfers, sitting, ambulation, stair navigation s restriction due to mobility. ?Goal status: New ?  ?    5.  Patient will demonstrate Rt LE MMT 5/5 throughout to facilitate ability to perform usual standing, walking, stairs at PLOF s limitation due to symptoms. ? Goal status: New ?  ?    6.  Patient will demonstrate independent ambulation community distances > 300 ft to facilitate community integration at  [facility].  ? Goal status: New ?  ?7.     Patient will demonstrate ability to ascend/descend stairs c reciprocal gait pattern with one hand rail for usual household navigation.  ?a.  Goal Status: New ?  ?  ?PLAN: ?PT FREQUE

## 2021-03-28 ENCOUNTER — Encounter: Payer: 59 | Admitting: Rehabilitative and Restorative Service Providers"

## 2021-04-05 ENCOUNTER — Ambulatory Visit: Payer: 59 | Admitting: Rehabilitative and Restorative Service Providers"

## 2021-04-05 ENCOUNTER — Encounter: Payer: Self-pay | Admitting: Rehabilitative and Restorative Service Providers"

## 2021-04-05 DIAGNOSIS — R600 Localized edema: Secondary | ICD-10-CM | POA: Diagnosis not present

## 2021-04-05 DIAGNOSIS — M25561 Pain in right knee: Secondary | ICD-10-CM

## 2021-04-05 DIAGNOSIS — M6281 Muscle weakness (generalized): Secondary | ICD-10-CM | POA: Diagnosis not present

## 2021-04-05 DIAGNOSIS — M25661 Stiffness of right knee, not elsewhere classified: Secondary | ICD-10-CM

## 2021-04-05 DIAGNOSIS — R262 Difficulty in walking, not elsewhere classified: Secondary | ICD-10-CM | POA: Diagnosis not present

## 2021-04-05 DIAGNOSIS — G8929 Other chronic pain: Secondary | ICD-10-CM

## 2021-04-05 NOTE — Therapy (Signed)
?OUTPATIENT PHYSICAL THERAPY TREATMENT NOTE ? ? ?Patient Name: Brian Oliver ?MRN: [id number] ?DOB:03-25-1955, 66 y.o., male ?Today's Date: 04/05/2021 ? ?PCP: [place], Raymond J, MD ?REFERRING PROVIDER: Mcarthur Rossetti* ? ? PT End of Session - 04/05/21 1609   ? ? Visit Number 3   ? Number of Visits 20   ? Date for PT Re-Evaluation 05/29/21   ? Authorization Type Friday $20   ? Authorization - Number of Visits 30   ? Progress Note Due on Visit 10   ? PT Start Time 1440   ? PT Stop Time 1529   ? PT Time Calculation (min) 49 min   ? Activity Tolerance Patient tolerated treatment well   ? Behavior During Therapy [facility] for tasks assessed/performed   ? ?  ?  ? ?  ? ? ? ?Past Medical History:  ?Diagnosis Date  ? Hepatitis C   ? Hyperlipidemia   ? Pre-diabetes   ? ?Past Surgical History:  ?Procedure Laterality Date  ? HERNIA REPAIR    ? TENDON REPAIR Left   ? TOTAL KNEE ARTHROPLASTY Right 03/02/2021  ? Procedure: Right TOTAL KNEE ARTHROPLASTY;  Surgeon: Mcarthur Rossetti, MD;  Location: [facility];  Service: Orthopedics;  Laterality: Right;  ? ?Patient Active Problem List  ? Diagnosis Date Noted  ? Status post total right knee replacement 03/02/2021  ? Arthritis of right knee 03/01/2021  ? Prediabetes 08/25/2020  ? Hyperlipidemia 08/08/2020  ? Obesity, Class I, BMI 30-34.9 08/08/2020  ? Effusion of right knee 08/08/2020  ? 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 ? ?REFERRING DIAG: V03.500 (ICD-10-CM) - Status post total right knee replacement ? ?THERAPY DIAG:  ?Difficulty in walking, not elsewhere classified ? ?Muscle weakness (generalized) ? ?Localized edema ? ?Stiffness of right knee, not elsewhere classified ? ?Chronic pain of right knee ? ?PERTINENT HISTORY: Hep C, Hyperlipidemia, pre-diabetes ? ?PRECAUTIONS: None ? ?SUBJECTIVE: Harwood reports good HEP compliance.  He is sleeping well with prescription pain medications. ? ?PAIN:  ?Are you having pain?  Over the past week Yes: NPRS scale: 0-5/10 ?Pain  location: Knee ?Pain description: R ?Aggravating factors: Achy and tight ?Relieving factors: Ice and pain medication ? ? ?OBJECTIVE:  ?  ?PATIENT SURVEYS:  ?FOTO intake: 46  predicted: 66 ?  ?COGNITION: ?           Overall cognitive status: Within functional limits for tasks assessed              ?  ?MUSCLE LENGTH: ?03/20/2021: no specific muscle length testing today ?  ?Swelling/Incision ?Localized edema in Rt knee/leg.  Incision unremarkable today upon inspection with steristrips noted.  ?  ?PALPATION: ?Mild tenderness along incision Rt knee.  ?  ?LE ROM: ?  ?ROM Right ?03/20/2021 Left ?03/20/2021 Right 04/05/2021  ?Hip flexion       ?Hip extension       ?Hip abduction       ?Hip adduction       ?Hip internal rotation       ?Hip external rotation       ?Knee flexion 76 in supine heel slide AROM ?  ?83 in supine heel slide c passive overpressure   80 degrees  ?Knee extension -4 in seated LAQ AROM 0 -4  degrees supine  ?Ankle dorsiflexion       ?Ankle plantarflexion       ?Ankle inversion       ?Ankle eversion       ? (Blank  rows = not tested) ?  ?LE MMT: ?  ?MMT Right ?03/20/2021 Left ?03/20/2021  ?Hip flexion 4/5 5/5  ?Hip extension      ?Hip abduction      ?Hip adduction      ?Hip internal rotation      ?Hip external rotation      ?Knee flexion 4/5 5/5  ?Knee extension 3+/5 5/5  ?Ankle dorsiflexion 5/5 5/5  ?Ankle plantarflexion      ?Ankle inversion      ?Ankle eversion      ? (Blank rows = not tested) ?  ?LOWER EXTREMITY SPECIAL TESTS:  ?03/20/2021: none performed ?  ?FUNCTIONAL TESTS:  ?03/20/2021: 20 inch table without UE c noted deviation to Lt LE WB. ?            Lt SLS: 20 seconds Rt: unable unassisted ?  ?GAIT: ?03/20/2021: ?Distance walked: Household distances < 150 ft  ?Assistive device utilized: FWW ?Level of assistance: Mod independent ?Comments: Reduced stance on Rt knee, mild limitation in TKE in stance, reduced step length c Lt foot ?  ?  ?  ?TODAY'S TREATMENT: ?04/05/2021 ?Supine quadriceps sets 20 total 5  seconds hold ? ?Seated knee flexion AAROM 20X total 10 seconds hold ? ?[place] knee flexion 3 minutes ? ?Supine heel slide with belt 10X 10 seconds ? ?Recumbent bike Seat 6 AAROM for 8 minutes ? ?Functional Activities: Leg press full range stretch into flexion and extension (5 second hold) 20X 50# slow eccentrics R leg only (for sit to stand and stairs) ? ?Vaso 10 minutes 34* Medium pressure R knee ? ? ?03/23/2021 ?Seated quadriceps sets 20 total 5 seconds hold ? ?Seated knee flexion AAROM 20X total 10 seconds hold ? ?Seated LAQ 10X 5 seconds ? ?Seated knee extension mobilization with weight 10# 1 minute ? ?Supine heel slide with belt 10X 10 seconds ? ?Recumbent bike Seat 6 AAROM for 8 minutes ? ?Functional Activities: Leg press full range stretch into flexion and extension (5 second hold) 20X 75# slow eccentrics (for sit to stand and stairs) ? ? ?03/20/2021: ?            Therex:            HEP instruction/performance c cues for techniques, handout provided.  Trial set performed of each for comprehension and symptom assessment.  See below for exercise list.  ?  ?  ?  ?PATIENT EDUCATION:  ?03/20/2021: ?Education details: HEP, POC ?Person educated: Patient ?Education method: Explanation, Demonstration, Verbal cues, and Handouts ?Education comprehension: verbalized understanding ?  ?  ?HOME EXERCISE PROGRAM: ?03/20/2021:  ?Access Code: [email or web address] ?URL: [email or web address] ?Date: 03/20/2021 ?Prepared by: Scot Jun ?  ?Exercises ?Seated Long Arc Quad - 3-5 x daily - 7 x weekly - 1 sets - 10 reps - 2 hold ?Supine Knee Extension Mobilization with Weight - 4 x daily - 7 x weekly - 1 sets - 1 reps - up to 15 mins hold ?Supine Heel Slide - 3-5 x daily - 7 x weekly - 1 sets - 10 reps ?Supine Heel Slide with Strap (Mirrored) - 3-5 x daily - 7 x weekly - 1 sets - 10 reps - 10 hold ?Seated Quad Set - 3-5 x daily - 7 x weekly - 1 sets - 10 reps - 5 hold ?Seated Knee Flexion Extension AAROM with Overpressure - 3-5 x  daily - 7 x weekly - 1 sets - 5 reps - 10 hold ?  ?  ?ASSESSMENT: ?  ?CLINICAL  IMPRESSION: ?Latavion reports and demonstrates good HEP compliance.  Extension AROM (-5 degrees) is on track while flexion AROM (80 degrees) is a bit behind schedule.  Damari knows flexion AROM is a high priority with his HEP. ?  ?  ?OBJECTIVE IMPAIRMENTS Abnormal gait, decreased activity tolerance, decreased balance, decreased coordination, decreased endurance, decreased mobility, difficulty walking, decreased ROM, decreased strength, hypomobility, increased edema, impaired perceived functional ability, impaired flexibility, improper body mechanics, postural dysfunction, and pain.  ?  ?ACTIVITY LIMITATIONS cleaning, community activity, driving, meal prep, occupation, and laundry.  ?  ?PERSONAL FACTORS  Hep C, Hyperlipidemia, pre-diabete  are also affecting patient's functional outcome.  ?  ?  ?REHAB POTENTIAL: Good ?  ?CLINICAL DECISION MAKING: Stable/uncomplicated ?  ?EVALUATION COMPLEXITY: Low ?  ?  ?GOALS: ?Goals reviewed with patient? Yes ?  ?Short term PT Goals (target date for Short term goals are 3 weeks 04/10/2021) ?Patient will demonstrate independent use of home exercise program to maintain progress from in clinic treatments. ?Goal status: Met 04/05/2021 ?  ?Long term PT goals (target dates for all long term goals are 10 weeks  05/29/2021 ) ?Patient will demonstrate/report pain at worst less than or equal to 2/10 to facilitate minimal limitation in daily activity secondary to pain symptoms. ?Goal status: New ?  ?Patient will demonstrate independent use of home exercise program to facilitate ability to maintain/progress functional gains from skilled physical therapy services. ?Goal status: New ?  ?Patient will demonstrate FOTO outcome > or = 66 % to indicate reduced disability due to condition. ?Goal status: New ?  ?Patient will demonstrate Rt  knee AROM 0-110 degrees to facilitate ability to perform transfers, sitting, ambulation,  stair navigation s restriction due to mobility. ?Goal status: New ?  ?    5.  Patient will demonstrate Rt LE MMT 5/5 throughout to facilitate ability to perform usual standing, walking, stairs at PLOF s lim

## 2021-04-06 ENCOUNTER — Ambulatory Visit: Payer: 59 | Admitting: Rehabilitative and Restorative Service Providers"

## 2021-04-06 ENCOUNTER — Encounter: Payer: Self-pay | Admitting: Rehabilitative and Restorative Service Providers"

## 2021-04-06 DIAGNOSIS — G8929 Other chronic pain: Secondary | ICD-10-CM

## 2021-04-06 DIAGNOSIS — M25661 Stiffness of right knee, not elsewhere classified: Secondary | ICD-10-CM | POA: Diagnosis not present

## 2021-04-06 DIAGNOSIS — R262 Difficulty in walking, not elsewhere classified: Secondary | ICD-10-CM | POA: Diagnosis not present

## 2021-04-06 DIAGNOSIS — M6281 Muscle weakness (generalized): Secondary | ICD-10-CM

## 2021-04-06 DIAGNOSIS — R600 Localized edema: Secondary | ICD-10-CM

## 2021-04-06 DIAGNOSIS — M25561 Pain in right knee: Secondary | ICD-10-CM

## 2021-04-06 NOTE — Therapy (Signed)
?OUTPATIENT PHYSICAL THERAPY TREATMENT NOTE ? ? ?Patient Name: Brian Oliver ?MRN: [id number] ?DOB:06-Apr-1955, 66 y.o., male ?Today's Date: 04/06/2021 ? ?PCP: de [place], Raymond J, MD ?REFERRING PROVIDER: de [place], Raymond J, MD ? ? PT End of Session - 04/06/21 1413   ? ? Visit Number 4   ? Number of Visits 20   ? Date for PT Re-Evaluation 05/29/21   ? Authorization Type Friday $20   ? Authorization - Number of Visits 30   ? Progress Note Due on Visit 10   ? PT Start Time 0962   ? PT Stop Time 1440   ? PT Time Calculation (min) 48 min   ? Activity Tolerance Patient tolerated treatment well   ? Behavior During Therapy [facility] for tasks assessed/performed   ? ?  ?  ? ?  ? ? ? ? ?Past Medical History:  ?Diagnosis Date  ? Hepatitis C   ? Hyperlipidemia   ? Pre-diabetes   ? ?Past Surgical History:  ?Procedure Laterality Date  ? HERNIA REPAIR    ? TENDON REPAIR Left   ? TOTAL KNEE ARTHROPLASTY Right 03/02/2021  ? Procedure: Right TOTAL KNEE ARTHROPLASTY;  Surgeon: Mcarthur Rossetti, MD;  Location: [facility];  Service: Orthopedics;  Laterality: Right;  ? ?Patient Active Problem List  ? Diagnosis Date Noted  ? Status post total right knee replacement 03/02/2021  ? Arthritis of right knee 03/01/2021  ? Prediabetes 08/25/2020  ? Hyperlipidemia 08/08/2020  ? Obesity, Class I, BMI 30-34.9 08/08/2020  ? Effusion of right knee 08/08/2020  ? 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 ? ?REFERRING DIAG: E36.629 (ICD-10-CM) - Status post total right knee replacement ? ?THERAPY DIAG:  ?Difficulty in walking, not elsewhere classified ? ?Muscle weakness (generalized) ? ?Localized edema ? ?Stiffness of right knee, not elsewhere classified ? ?Chronic pain of right knee ? ?PERTINENT HISTORY: Hep C, Hyperlipidemia, pre-diabetes ? ?PRECAUTIONS: None ? ?SUBJECTIVE: Brian Oliver reports continued excellent HEP compliance.  He is sleeping well with prescription pain medications.  He is also using ice at home. ? ?PAIN:  ?Are you having pain?  Over the  past week Yes: NPRS scale: 0-5/10 ?Pain location: Knee ?Pain description: R ?Aggravating factors: Achy and tight ?Relieving factors: Ice and pain medication ? ? ?OBJECTIVE:  ?  ?PATIENT SURVEYS:  ?FOTO intake: 46  predicted: 66 ?  ?COGNITION: ?           Overall cognitive status: Within functional limits for tasks assessed              ?  ?MUSCLE LENGTH: ?03/20/2021: no specific muscle length testing today ?  ?Swelling/Incision ?Localized edema in Rt knee/leg.  Incision unremarkable today upon inspection with steristrips noted.  ?  ?PALPATION: ?Mild tenderness along incision Rt knee.  ?  ?LE ROM: ?  ?ROM Right ?03/20/2021 Left ?03/20/2021 Right 04/05/2021  ?Hip flexion       ?Hip extension       ?Hip abduction       ?Hip adduction       ?Hip internal rotation       ?Hip external rotation       ?Knee flexion 76 in supine heel slide AROM ?  ?83 in supine heel slide c passive overpressure   80 degrees  ?Knee extension -4 in seated LAQ AROM 0 -4  degrees supine  ?Ankle dorsiflexion       ?Ankle plantarflexion       ?Ankle inversion       ?  Ankle eversion       ? (Blank rows = not tested) ?  ?LE MMT: ?  ?MMT Right ?03/20/2021 Left ?03/20/2021  ?Hip flexion 4/5 5/5  ?Hip extension      ?Hip abduction      ?Hip adduction      ?Hip internal rotation      ?Hip external rotation      ?Knee flexion 4/5 5/5  ?Knee extension 3+/5 5/5  ?Ankle dorsiflexion 5/5 5/5  ?Ankle plantarflexion      ?Ankle inversion      ?Ankle eversion      ? (Blank rows = not tested) ?  ?LOWER EXTREMITY SPECIAL TESTS:  ?03/20/2021: none performed ?  ?FUNCTIONAL TESTS:  ?03/20/2021: 20 inch table without UE c noted deviation to Lt LE WB. ?            Lt SLS: 20 seconds Rt: unable unassisted ?  ?GAIT: ?03/20/2021: ?Distance walked: Household distances < 150 ft  ?Assistive device utilized: FWW ?Level of assistance: Mod independent ?Comments: Reduced stance on Rt knee, mild limitation in TKE in stance, reduced step length c Lt foot ?  ?  ?  ?TODAY'S  TREATMENT: ?04/06/2021 ?Supine quadriceps sets 20 total 5 seconds hold ? ?Seated knee flexion AAROM 20X total 10 seconds hold ? ?Tailgate knee flexion 1 minute ? ?Supine heel slide with belt 10X 10 seconds ? ?Recumbent bike Seat 6 AAROM for 8 minutes ? ?Functional Activities: Leg press full range stretch into flexion and extension (5 second hold) 20X 50# slow eccentrics R leg only (for sit to stand and stairs) ? ?Vaso 10 minutes 34* Medium pressure R knee ? ? ?04/05/2021 ?Supine quadriceps sets 20 total 5 seconds hold ? ?Seated knee flexion AAROM 20X total 10 seconds hold ? ?Tailgate knee flexion 3 minutes ? ?Supine heel slide with belt 10X 10 seconds ? ?Recumbent bike Seat 6 AAROM for 8 minutes ? ?Functional Activities: Leg press full range stretch into flexion and extension (5 second hold) 20X 50# slow eccentrics R leg only (for sit to stand and stairs) ? ?Vaso 10 minutes 34* Medium pressure R knee ? ? ?03/23/2021 ?Seated quadriceps sets 20 total 5 seconds hold ? ?Seated knee flexion AAROM 20X total 10 seconds hold ? ?Seated LAQ 10X 5 seconds ? ?Seated knee extension mobilization with weight 10# 1 minute ? ?Supine heel slide with belt 10X 10 seconds ? ?Recumbent bike Seat 6 AAROM for 8 minutes ? ?Functional Activities: Leg press full range stretch into flexion and extension (5 second hold) 20X 75# slow eccentrics (for sit to stand and stairs) ? ? ?  ?PATIENT EDUCATION:  ?03/20/2021: ?Education details: HEP, POC ?Person educated: Patient ?Education method: Explanation, Demonstration, Verbal cues, and Handouts ?Education comprehension: verbalized understanding ?  ?  ?HOME EXERCISE PROGRAM: ?03/20/2021:  ?Access Code: [email or web address] ?URL: [email or web address] ?Date: 03/20/2021 ?Prepared by: Scot Jun ?  ?Exercises ?Seated Long Arc Quad - 3-5 x daily - 7 x weekly - 1 sets - 10 reps - 2 hold ?Supine Knee Extension Mobilization with Weight - 4 x daily - 7 x weekly - 1 sets - 1 reps - up to 15 mins hold ?Supine  Heel Slide - 3-5 x daily - 7 x weekly - 1 sets - 10 reps ?Supine Heel Slide with Strap (Mirrored) - 3-5 x daily - 7 x weekly - 1 sets - 10 reps - 10 hold ?Seated Quad Set - 3-5 x daily - 7 x weekly - 1 sets -  10 reps - 5 hold ?Seated Knee Flexion Extension AAROM with Overpressure - 3-5 x daily - 7 x weekly - 1 sets - 5 reps - 10 hold ?  ?  ?ASSESSMENT: ?  ?CLINICAL IMPRESSION: ?Brian Oliver continues to have excellent HEP compliance.  Extension AROM (-5 degrees) is on track while flexion AROM (80 degrees) is a bit behind schedule and is a focus of his home and clinic program.  Brian Oliver knows flexion AROM is a high priority with his HEP. ?  ?  ?OBJECTIVE IMPAIRMENTS Abnormal gait, decreased activity tolerance, decreased balance, decreased coordination, decreased endurance, decreased mobility, difficulty walking, decreased ROM, decreased strength, hypomobility, increased edema, impaired perceived functional ability, impaired flexibility, improper body mechanics, postural dysfunction, and pain.  ?  ?ACTIVITY LIMITATIONS cleaning, community activity, driving, meal prep, occupation, and laundry.  ?  ?PERSONAL FACTORS  Hep C, Hyperlipidemia, pre-diabete  are also affecting patient's functional outcome.  ?  ?  ?REHAB POTENTIAL: Good ?  ?CLINICAL DECISION MAKING: Stable/uncomplicated ?  ?EVALUATION COMPLEXITY: Low ?  ?  ?GOALS: ?Goals reviewed with patient? Yes ?  ?Short term PT Goals (target date for Short term goals are 3 weeks 04/10/2021) ?Patient will demonstrate independent use of home exercise program to maintain progress from in clinic treatments. ?Goal status: Met 04/05/2021 ?  ?Long term PT goals (target dates for all long term goals are 10 weeks  05/29/2021 ) ?Patient will demonstrate/report pain at worst less than or equal to 2/10 to facilitate minimal limitation in daily activity secondary to pain symptoms. ?Goal status: New ?  ?Patient will demonstrate independent use of home exercise program to facilitate ability to  maintain/progress functional gains from skilled physical therapy services. ?Goal status: New ?  ?Patient will demonstrate FOTO outcome > or = 66 % to indicate reduced disability due to condition. ?Goal status: New ?  ?Brian Oliver

## 2021-04-10 ENCOUNTER — Encounter: Payer: Self-pay | Admitting: Rehabilitative and Restorative Service Providers"

## 2021-04-10 ENCOUNTER — Ambulatory Visit: Payer: 59 | Admitting: Rehabilitative and Restorative Service Providers"

## 2021-04-10 DIAGNOSIS — R600 Localized edema: Secondary | ICD-10-CM | POA: Diagnosis not present

## 2021-04-10 DIAGNOSIS — M6281 Muscle weakness (generalized): Secondary | ICD-10-CM

## 2021-04-10 DIAGNOSIS — M25661 Stiffness of right knee, not elsewhere classified: Secondary | ICD-10-CM

## 2021-04-10 DIAGNOSIS — R262 Difficulty in walking, not elsewhere classified: Secondary | ICD-10-CM | POA: Diagnosis not present

## 2021-04-10 DIAGNOSIS — G8929 Other chronic pain: Secondary | ICD-10-CM

## 2021-04-10 DIAGNOSIS — M25561 Pain in right knee: Secondary | ICD-10-CM

## 2021-04-10 NOTE — Therapy (Addendum)
?OUTPATIENT PHYSICAL THERAPY TREATMENT NOTE /DISCHARGE ? ? ?Patient Name: Brian Oliver ?MRN: [id number] ?DOB:04/17/1955, 66 y.o., male ?Today's Date: 04/10/2021 ? ?PCP: de [place], Raymond J, MD ?REFERRING PROVIDER: Mcarthur Rossetti* ? ? PT End of Session - 04/10/21 1432   ? ? Visit Number 5   ? Number of Visits 20   ? Date for PT Re-Evaluation 05/29/21   ? Authorization Type Friday $20   ? Authorization - Number of Visits 30   ? Progress Note Due on Visit 10   ? PT Start Time 1430   ? PT Stop Time 1525   ? PT Time Calculation (min) 55 min   ? Activity Tolerance Patient tolerated treatment well   ? Behavior During Therapy [facility] for tasks assessed/performed   ? ?  ?  ? ?  ? ? ? ? ? ?Past Medical History:  ?Diagnosis Date  ? Hepatitis C   ? Hyperlipidemia   ? Pre-diabetes   ? ?Past Surgical History:  ?Procedure Laterality Date  ? HERNIA REPAIR    ? TENDON REPAIR Left   ? TOTAL KNEE ARTHROPLASTY Right 03/02/2021  ? Procedure: Right TOTAL KNEE ARTHROPLASTY;  Surgeon: Mcarthur Rossetti, MD;  Location: [facility];  Service: Orthopedics;  Laterality: Right;  ? ?Patient Active Problem List  ? Diagnosis Date Noted  ? Status post total right knee replacement 03/02/2021  ? Arthritis of right knee 03/01/2021  ? Prediabetes 08/25/2020  ? Hyperlipidemia 08/08/2020  ? Obesity, Class I, BMI 30-34.9 08/08/2020  ? Effusion of right knee 08/08/2020  ? 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 ? ?REFERRING DIAG: O17.711 (ICD-10-CM) - Status post total right knee replacement ? ?THERAPY DIAG:  ?Difficulty in walking, not elsewhere classified ? ?Muscle weakness (generalized) ? ?Localized edema ? ?Stiffness of right knee, not elsewhere classified ? ?Chronic pain of right knee ? ?PERTINENT HISTORY: Hep C, Hyperlipidemia, pre-diabetes ? ?PRECAUTIONS: None ? ?SUBJECTIVE: Brian Oliver continues his excellent HEP compliance.  He is sleeping well with prescription pain medications.  He is also using ice at home.  He is anxious to see if his  AROM improves because he has been working so hard at home. ? ?PAIN:  ?Are you having pain?  Over the past week Yes: NPRS scale: 0-5/10 ?Pain location: Knee ?Pain description: R ?Aggravating factors: Achy and tight ?Relieving factors: Ice and pain medication ? ? ?OBJECTIVE:  ?  ?PATIENT SURVEYS:  ?FOTO 04/10/2021 63 (was 46, Goal 66) ?FOTO intake: 46  predicted: 66 ?  ?COGNITION: ?           Overall cognitive status: Within functional limits for tasks assessed              ?  ?MUSCLE LENGTH: ?03/20/2021: no specific muscle length testing today ?  ?Swelling/Incision ?Localized edema in Rt knee/leg.  Incision unremarkable today upon inspection with steristrips noted.  ?  ?PALPATION: ?Mild tenderness along incision Rt knee.  ?  ?LE ROM: ?  ?ROM Right ?03/20/2021 Left ?03/20/2021 Right 04/05/2021 Right 04/10/2021  ?Hip flexion        ?Hip extension        ?Hip abduction        ?Hip adduction        ?Hip internal rotation        ?Hip external rotation        ?Knee flexion 76 in supine heel slide AROM ?  ?83 in supine heel slide c passive overpressure   80 degrees 90 degrees  ?  Knee extension -4 in seated LAQ AROM 0 -4  degrees supine -2 degrees supine  ?Ankle dorsiflexion        ?Ankle plantarflexion        ?Ankle inversion        ?Ankle eversion        ? (Blank rows = not tested) ?  ?LE MMT: ?  ?MMT Right ?03/20/2021 Left ?03/20/2021  ?Hip flexion 4/5 5/5  ?Hip extension      ?Hip abduction      ?Hip adduction      ?Hip internal rotation      ?Hip external rotation      ?Knee flexion 4/5 5/5  ?Knee extension 3+/5 5/5  ?Ankle dorsiflexion 5/5 5/5  ?Ankle plantarflexion      ?Ankle inversion      ?Ankle eversion      ? (Blank rows = not tested) ?  ?LOWER EXTREMITY SPECIAL TESTS:  ?03/20/2021: none performed ?  ?FUNCTIONAL TESTS:  ?03/20/2021: 20 inch table without UE c noted deviation to Lt LE WB. ?            Lt SLS: 20 seconds Rt: unable unassisted ?  ?GAIT: ?03/20/2021: ?Distance walked: Household distances < 150 ft  ?Assistive  device utilized: FWW ?Level of assistance: Mod independent ?Comments: Reduced stance on Rt knee, mild limitation in TKE in stance, reduced step length c Lt foot ?  ?  ?  ?TODAY'S TREATMENT: ?04/10/2021 ?Supine quadriceps sets 20 total 5 seconds hold ? ?Prone quadriceps stretch R (belt over L shoulder) 5X 20 seconds ? ?Seated knee flexion AAROM 20X total 10 seconds hold ? ?Tailgate knee flexion 1 minute ? ?Recumbent bike Seat 6 AAROM for 8 minutes ? ?Functional Activities: Leg press full range stretch into flexion and extension (5 second hold) 20X 50# slow eccentrics R leg only (for sit to stand and stairs) ? ?Vaso 10 minutes 34* Medium pressure R knee ? ? ?04/06/2021 ?Supine quadriceps sets 20 total 5 seconds hold ? ?Seated knee flexion AAROM 20X total 10 seconds hold ? ?Tailgate knee flexion 1 minute ? ?Supine heel slide with belt 10X 10 seconds ? ?Recumbent bike Seat 6 AAROM for 8 minutes ? ?Functional Activities: Leg press full range stretch into flexion and extension (5 second hold) 20X 50# slow eccentrics R leg only (for sit to stand and stairs) ? ?Vaso 10 minutes 34* Medium pressure R knee ? ? ?04/05/2021 ?Supine quadriceps sets 20 total 5 seconds hold ? ?Seated knee flexion AAROM 20X total 10 seconds hold ? ?Tailgate knee flexion 3 minutes ? ?Supine heel slide with belt 10X 10 seconds ? ?Recumbent bike Seat 6 AAROM for 8 minutes ? ?Functional Activities: Leg press full range stretch into flexion and extension (5 second hold) 20X 50# slow eccentrics R leg only (for sit to stand and stairs) ? ?Vaso 10 minutes 34* Medium pressure R knee ? ?  ?PATIENT EDUCATION:  ?03/20/2021: ?Education details: HEP, POC ?Person educated: Patient ?Education method: Explanation, Demonstration, Verbal cues, and Handouts ?Education comprehension: verbalized understanding ?  ?  ?HOME EXERCISE PROGRAM: ?03/20/2021:  ?Access Code: [email or web address] ?URL: [email or web address] ?Date: 03/20/2021 ?Prepared by: Scot Jun ?   ?Exercises ?Seated Long Arc Quad - 3-5 x daily - 7 x weekly - 1 sets - 10 reps - 2 hold ?Supine Knee Extension Mobilization with Weight - 4 x daily - 7 x weekly - 1 sets - 1 reps - up to 15 mins hold ?Supine Heel Slide - 3-5 x daily -  7 x weekly - 1 sets - 10 reps ?Supine Heel Slide with Strap (Mirrored) - 3-5 x daily - 7 x weekly - 1 sets - 10 reps - 10 hold ?Seated Quad Set - 3-5 x daily - 7 x weekly - 1 sets - 10 reps - 5 hold ?Seated Knee Flexion Extension AAROM with Overpressure - 3-5 x daily - 7 x weekly - 1 sets - 5 reps - 10 hold ?  ?  ?ASSESSMENT: ?  ?CLINICAL IMPRESSION: ?Katherine continues to have excellent HEP compliance.  Extension AROM (-2 degrees) is on track (only edema limited) while flexion AROM (90 degrees) is a bit slower to progress.. Flexion AROM and quadriceps strength remain the early  focus of his home and clinic program.  Orvie' prognosis remains good with his current HEP compliance and attendance in PT. ?  ?  ?OBJECTIVE IMPAIRMENTS Abnormal gait, decreased activity tolerance, decreased balance, decreased coordination, decreased endurance, decreased mobility, difficulty walking, decreased ROM, decreased strength, hypomobility, increased edema, impaired perceived functional ability, impaired flexibility, improper body mechanics, postural dysfunction, and pain.  ?  ?ACTIVITY LIMITATIONS cleaning, community activity, driving, meal prep, occupation, and laundry.  ?  ?PERSONAL FACTORS  Hep C, Hyperlipidemia, pre-diabete  are also affecting patient's functional outcome.  ?  ?  ?REHAB POTENTIAL: Good ?  ?CLINICAL DECISION MAKING: Stable/uncomplicated ?  ?EVALUATION COMPLEXITY: Low ?  ?  ?GOALS: ?Goals reviewed with patient? Yes ?  ?Short term PT Goals (target date for Short term goals are 3 weeks 04/10/2021) ?Patient will demonstrate independent use of home exercise program to maintain progress from in clinic treatments. ?Goal status: Met 04/05/2021 ?  ?Long term PT goals (target dates for all long  term goals are 10 weeks  05/29/2021 ) ?Patient will demonstrate/report pain at worst less than or equal to 2/10 to facilitate minimal limitation in daily activity secondary to pain symptoms. ?Goal status: 0-5/10 04/10/2021

## 2021-04-12 ENCOUNTER — Ambulatory Visit (INDEPENDENT_AMBULATORY_CARE_PROVIDER_SITE_OTHER): Payer: 59 | Admitting: Orthopaedic Surgery

## 2021-04-12 ENCOUNTER — Encounter: Payer: Self-pay | Admitting: Orthopaedic Surgery

## 2021-04-12 DIAGNOSIS — Z96651 Presence of right artificial knee joint: Secondary | ICD-10-CM

## 2021-04-12 MED ORDER — OXYCODONE HCL 5 MG PO TABS: 5.0000 mg | ORAL_TABLET | Freq: Four times a day (QID) | ORAL | 0 refills | Status: DC | PRN

## 2021-04-12 NOTE — Progress Notes (Signed)
The patient is 6 weeks status post a right total knee arthroplasty.  He is 66 years old.  He has been compliant with going to physical therapy.  He is only been able to flex to about 90 degrees. ? ?In the office today after pushing just barely beyond 90 degrees but it is painful.  He only has pain with therapy.  He does need a refill of oxycodone.  Family is with him to interpret for him he is non-English-speaking.  His calf is soft on my exam today.  His incision looks good. ? ?I decided today to actually place a steroid injection in his right operative knee to see if this would help with pain control and can push him to get his knee flexing better from a therapy standpoint.  I think he has a good chance of not needing manipulation.  I did refill his oxycodone.  I would like to see him back in just 2 weeks for repeat exam.  No x-rays are needed. ?

## 2021-04-17 ENCOUNTER — Encounter: Payer: 59 | Admitting: Physical Therapy

## 2021-04-17 NOTE — Therapy (Incomplete)
?OUTPATIENT PHYSICAL THERAPY TREATMENT NOTE ? ? ?Patient Name: Brian Oliver ?MRN: [id number] ?DOB:February 04, 1955, 66 y.o., male ?Today's Date: 04/17/2021 ? ?PCP: de [place], Raymond J, MD ?REFERRING PROVIDER: Mcarthur Rossetti* ? ? ? ? ? ? ? ?Past Medical History:  ?Diagnosis Date  ? Hepatitis C   ? Hyperlipidemia   ? Pre-diabetes   ? ?Past Surgical History:  ?Procedure Laterality Date  ? HERNIA REPAIR    ? TENDON REPAIR Left   ? TOTAL KNEE ARTHROPLASTY Right 03/02/2021  ? Procedure: Right TOTAL KNEE ARTHROPLASTY;  Surgeon: Mcarthur Rossetti, MD;  Location: [facility];  Service: Orthopedics;  Laterality: Right;  ? ?Patient Active Problem List  ? Diagnosis Date Noted  ? Status post total right knee replacement 03/02/2021  ? Arthritis of right knee 03/01/2021  ? Prediabetes 08/25/2020  ? Hyperlipidemia 08/08/2020  ? Obesity, Class I, BMI 30-34.9 08/08/2020  ? Effusion of right knee 08/08/2020  ? 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 ? ?REFERRING DIAG: N56.213 (ICD-10-CM) - Status post total right knee replacement ? ?THERAPY DIAG:  ?No diagnosis found. ? ?PERTINENT HISTORY: Hep C, Hyperlipidemia, pre-diabetes ? ?PRECAUTIONS: None ? ?SUBJECTIVE: *** Brian Oliver continues his excellent HEP compliance.  He is sleeping well with prescription pain medications.  He is also using ice at home.  He is anxious to see if his AROM improves because he has been working so hard at home. ? ?PAIN:  ?Are you having pain? *** Over the past week Yes: NPRS scale: 0-5/10 ?Pain location: Knee ?Pain description: R ?Aggravating factors: Achy and tight ?Relieving factors: Ice and pain medication ? ? ?OBJECTIVE:  ?  ?PATIENT SURVEYS:  ?FOTO 04/10/2021 63 (was 46, Goal 66) ?FOTO intake: 46  predicted: 66 ?  ?COGNITION: ?           Overall cognitive status: Within functional limits for tasks assessed              ?  ?MUSCLE LENGTH: ?03/20/2021: no specific muscle length testing today ?  ?Swelling/Incision ?Localized edema in Rt knee/leg.   Incision unremarkable today upon inspection with steristrips noted.  ?  ?PALPATION: ?Mild tenderness along incision Rt knee.  ?  ?LE ROM: ?  ?ROM Right ?03/20/2021 Left ?03/20/2021 Right 04/05/2021 Right 04/10/2021  ?Hip flexion        ?Hip extension        ?Hip abduction        ?Hip adduction        ?Hip internal rotation        ?Hip external rotation        ?Knee flexion 76 in supine heel slide AROM ?  ?83 in supine heel slide c passive overpressure   80 degrees 90 degrees  ?Knee extension -4 in seated LAQ AROM 0 -4  degrees supine -2 degrees supine  ?Ankle dorsiflexion        ?Ankle plantarflexion        ?Ankle inversion        ?Ankle eversion        ? (Blank rows = not tested) ?  ?LE MMT: ?  ?MMT Right ?03/20/2021 Left ?03/20/2021  ?Hip flexion 4/5 5/5  ?Hip extension      ?Hip abduction      ?Hip adduction      ?Hip internal rotation      ?Hip external rotation      ?Knee flexion 4/5 5/5  ?Knee extension 3+/5 5/5  ?Ankle dorsiflexion 5/5 5/5  ?Ankle plantarflexion      ?  Ankle inversion      ?Ankle eversion      ? (Blank rows = not tested) ?  ?LOWER EXTREMITY SPECIAL TESTS:  ?03/20/2021: none performed ?  ?FUNCTIONAL TESTS:  ?03/20/2021: 20 inch table without UE c noted deviation to Lt LE WB. ?            Lt SLS: 20 seconds Rt: unable unassisted ?  ?GAIT: ?03/20/2021: ?Distance walked: Household distances < 150 ft  ?Assistive device utilized: FWW ?Level of assistance: Mod independent ?Comments: Reduced stance on Rt knee, mild limitation in TKE in stance, reduced step length c Lt foot ?  ?  ?  ?TODAY'S TREATMENT: ?04/17/2021 ?*** ? ?04/10/2021 ?Supine quadriceps sets 20 total 5 seconds hold ? ?Prone quadriceps stretch R (belt over L shoulder) 5X 20 seconds ? ?Seated knee flexion AAROM 20X total 10 seconds hold ? ?Tailgate knee flexion 1 minute ? ?Recumbent bike Seat 6 AAROM for 8 minutes ? ?Functional Activities: Leg press full range stretch into flexion and extension (5 second hold) 20X 50# slow eccentrics R leg only (for  sit to stand and stairs) ? ?Vaso 10 minutes 34* Medium pressure R knee ? ? ?04/06/2021 ?Supine quadriceps sets 20 total 5 seconds hold ? ?Seated knee flexion AAROM 20X total 10 seconds hold ? ?Tailgate knee flexion 1 minute ? ?Supine heel slide with belt 10X 10 seconds ? ?Recumbent bike Seat 6 AAROM for 8 minutes ? ?Functional Activities: Leg press full range stretch into flexion and extension (5 second hold) 20X 50# slow eccentrics R leg only (for sit to stand and stairs) ? ?Vaso 10 minutes 34* Medium pressure R knee ? ?  ?PATIENT EDUCATION:  ?03/20/2021: ?Education details: HEP, POC ?Person educated: Patient ?Education method: Explanation, Demonstration, Verbal cues, and Handouts ?Education comprehension: verbalized understanding ?  ?  ?HOME EXERCISE PROGRAM: ?03/20/2021:  ?Access Code: [email or web address] ?URL: [email or web address] ?Date: 03/20/2021 ?Prepared by: Scot Jun ?  ?Exercises ?Seated Long Arc Quad - 3-5 x daily - 7 x weekly - 1 sets - 10 reps - 2 hold ?Supine Knee Extension Mobilization with Weight - 4 x daily - 7 x weekly - 1 sets - 1 reps - up to 15 mins hold ?Supine Heel Slide - 3-5 x daily - 7 x weekly - 1 sets - 10 reps ?Supine Heel Slide with Strap (Mirrored) - 3-5 x daily - 7 x weekly - 1 sets - 10 reps - 10 hold ?Seated Quad Set - 3-5 x daily - 7 x weekly - 1 sets - 10 reps - 5 hold ?Seated Knee Flexion Extension AAROM with Overpressure - 3-5 x daily - 7 x weekly - 1 sets - 5 reps - 10 hold ?  ?  ?ASSESSMENT: ?  ?CLINICAL IMPRESSION: ?*** Brian Oliver continues to have excellent HEP compliance.  Extension AROM (-2 degrees) is on track (only edema limited) while flexion AROM (90 degrees) is a bit slower to progress.. Flexion AROM and quadriceps strength remain the early  focus of his home and clinic program.  Brian Oliver' prognosis remains good with his current HEP compliance and attendance in PT. ?  ?  ?OBJECTIVE IMPAIRMENTS Abnormal gait, decreased activity tolerance, decreased balance, decreased  coordination, decreased endurance, decreased mobility, difficulty walking, decreased ROM, decreased strength, hypomobility, increased edema, impaired perceived functional ability, impaired flexibility, improper body mechanics, postural dysfunction, and pain.  ?  ?ACTIVITY LIMITATIONS cleaning, community activity, driving, meal prep, occupation, and laundry.  ?  ?PERSONAL FACTORS  Hep C, Hyperlipidemia, pre-diabete  are  also affecting patient's functional outcome.  ?  ?  ?REHAB POTENTIAL: Good ?  ?CLINICAL DECISION MAKING: Stable/uncomplicated ?  ?EVALUATION COMPLEXITY: Low ?  ?  ?GOALS: ?Goals reviewed with patient? Yes ?  ?Short term PT Goals (target date for Short term goals are 3 weeks 04/10/2021) ?Patient will demonstrate independent use of home exercise program to maintain progress from in clinic treatments. ?Goal status: Met 04/05/2021 ?  ?Long term PT goals (target dates for all long term goals are 10 weeks  05/29/2021 ) ?Patient will demonstrate/report pain at worst less than or equal to 2/10 to facilitate minimal limitation in daily activity secondary to pain symptoms. ?Goal status: 0-5/10 04/10/2021 ?  ?Patient will demonstrate independent use of home exercise program to facilitate ability to maintain/progress functional gains from skilled physical therapy services. ?Goal status: Non Going 04/10/2021 ?  ?Patient will demonstrate FOTO outcome > or = 66 % to indicate reduced disability due to condition. ?Goal status: 63 (was 46, Goal 66) ?  ?Patient will demonstrate Rt  knee AROM 0-110 degrees to facilitate ability to perform transfers, sitting, ambulation, stair navigation s restriction due to mobility. ?Goal status: -2 to 90 04/10/2021 ?  ?    5.  Patient will demonstrate Rt LE MMT 5/5 throughout to facilitate ability to perform usual standing, walking, stairs at PLOF s limitation due to symptoms. ? Goal status: On Going 04/10/2021 ?  ?    6.  Patient will demonstrate independent ambulation community distances > 300  ft to facilitate community integration at [facility].  ? Goal status: Using cane 04/10/2021 ?  ?7.     Patient will demonstrate ability to ascend/descend stairs c reciprocal gait pattern with one hand rail for usual ho

## 2021-04-19 ENCOUNTER — Encounter: Payer: 59 | Admitting: Rehabilitative and Restorative Service Providers"

## 2021-04-26 ENCOUNTER — Encounter: Payer: Self-pay | Admitting: Orthopaedic Surgery

## 2021-04-26 ENCOUNTER — Ambulatory Visit (INDEPENDENT_AMBULATORY_CARE_PROVIDER_SITE_OTHER): Payer: 59 | Admitting: Orthopaedic Surgery

## 2021-04-26 DIAGNOSIS — Z96651 Presence of right artificial knee joint: Secondary | ICD-10-CM

## 2021-04-26 NOTE — Progress Notes (Signed)
HPI: Brian Oliver returns today for follow-up of his right total knee arthroplasty.  He was seen on 04/12/2021 and at that time was given steroid injection by Brian Oliver.  He states developed some chills since having the steroid injection but overall the knee feels better.  He feels his range of motion and strength improving.  He denies any upper respiratory infections, fevers, constipation bowel bladder dysfunction. ? ?Physical exam:  ?Temperature 98.0 ?F ?Right knee he has full extension flexion to 105 degrees.  No abnormal warmth erythema.  Surgical incisions healing well.  Calf supple nontender. ? ?Impression: Status post right total knee arthroplasty 03/02/2021 ? ?Plan: He will continue to work on range of motion and strengthening.  Continue to work on scar tissue mobilization.  Questions were encouraged and answered at length.  He will follow-up with [place] in 4 weeks.  Questions were encouraged and answered by Brian Oliver and myself. Continue PT. ?

## 2021-05-24 ENCOUNTER — Ambulatory Visit (INDEPENDENT_AMBULATORY_CARE_PROVIDER_SITE_OTHER): Payer: 59 | Admitting: Orthopaedic Surgery

## 2021-05-24 ENCOUNTER — Encounter: Payer: Self-pay | Admitting: Orthopaedic Surgery

## 2021-05-24 DIAGNOSIS — Z96651 Presence of right artificial knee joint: Secondary | ICD-10-CM

## 2021-05-24 NOTE — Progress Notes (Signed)
The patient is right at 12 weeks status post a right knee replacement.  He is non-English-speaking.  He is a very active 66 year old gentleman and does have family with him to interpret.  He does feel like he is ready to go back to work but needs restrictions as a relates to work.  He does not need pain medicine he states.  He reports clicking in the knee but overall good range of motion and strength and he feels like he is doing well.  On exam he does still have swelling to be expected with his right knee and this will decrease with time.  His extension is full and his flexion is to almost 120 degrees with only limitations related to the swelling of the knee.  His knee does feel ligamentously stable.  I did give him a note for work restrictions of lifting no greater than 40 pounds and no climbing ladders as well as crawling and stooping.  He should be allowed to rest his knee on occasion.  We will see him back in 6 months unless there are issues.  At that visit we will have a standing AP and lateral of his right operative knee.

## 2021-06-06 ENCOUNTER — Telehealth: Payer: Self-pay | Admitting: Orthopaedic Surgery

## 2021-06-06 NOTE — Telephone Encounter (Signed)
Patient's son Brian Oliver stopped by asking about the disability paperwork. Brian Oliver would like to get a copy of the paperwork and will pick it up.  Brian Oliver also asked when was the paperwork faxed?  The number  to contact Brian Oliver is  [phone]

## 2021-11-26 ENCOUNTER — Ambulatory Visit: Payer: 59 | Admitting: Orthopaedic Surgery

## 2022-06-11 ENCOUNTER — Ambulatory Visit (INDEPENDENT_AMBULATORY_CARE_PROVIDER_SITE_OTHER): Payer: 59

## 2022-06-11 ENCOUNTER — Encounter (HOSPITAL_BASED_OUTPATIENT_CLINIC_OR_DEPARTMENT_OTHER): Payer: Self-pay | Admitting: Family Medicine

## 2022-06-11 ENCOUNTER — Ambulatory Visit (HOSPITAL_BASED_OUTPATIENT_CLINIC_OR_DEPARTMENT_OTHER): Payer: 59 | Admitting: Family Medicine

## 2022-06-11 VITALS — BP 158/84 | HR 70 | Temp 97.6°F | Ht 66.0 in | Wt 205.9 lb

## 2022-06-11 DIAGNOSIS — M25521 Pain in right elbow: Secondary | ICD-10-CM | POA: Insufficient documentation

## 2022-06-11 DIAGNOSIS — Z0000 Encounter for general adult medical examination without abnormal findings: Secondary | ICD-10-CM

## 2022-06-11 NOTE — Progress Notes (Signed)
    Procedures performed today:    None.  Independent interpretation of notes and tests performed by another provider:   None.  Brief History, Exam, Impression, and Recommendations:    BP (!) 158/84 (BP Location: Right Arm, Patient Position: Sitting, Cuff Size: Normal)   Pulse 70   Temp 97.6 F (36.4 C) (Oral)   Ht 5\' 6"  (1.676 m)   Wt 205 lb 14.4 oz (93.4 kg)   SpO2 100%   BMI 33.23 kg/m   Right elbow pain Right elbow pain - first started in 1995. Worsened more recently, pain in a few areas around the right elbow, not able to identify one specific spot. Does not completely extend elbow due to pain at end of extension. He is right-handed. No numbness or tingling.  On exam, moderate swelling appreciated along medial aspect of right elbow.  Moderate tenderness to palpation about the elbow, primarily along medial aspect of elbow.  Limited active and passive range of motion, particularly with extension.  Normal strength for flexion and extension at elbow.  There is pain with palpation along medial aspect of elbow as well as with passive attempts at extension primarily.  Distal neurovascular exam is intact.  Normal strength for wrist flexion and extension.  Normal grip strength bilaterally. Concern for intra-articular pathology given history and exam today.  Interestingly, patient did have similar clinical picture with knee issue for which he was seen previously.  At that time, was found to have degree of arthritis, synovitis, notable joint effusion.  He had a somewhat similar findings with right elbow.  Feel that proceeding with initial x-ray is most appropriate and ultimately feel that he will need to meet with orthopedic surgeon to further review treatment options and determine if additional imaging is needed such as MRI  Return in about 4 months (around 10/11/2022) for CPE with [facility]W 1 week prior.   ___________________________________________ Negin Hegg de [place], MD, ABFM, CAQSM Primary Care  and [facility]

## 2022-06-11 NOTE — Patient Instructions (Signed)
  Medication Instructions:  Your physician recommends that you continue on your current medications as directed. Please refer to the Current Medication list given to you today. --If you need a refill on any your medications before your next appointment, please call your pharmacy first. If no refills are authorized on file call the office.-- Lab Work: Your physician has recommended that you have lab work today: No If you have labs (blood work) drawn today and your tests are completely normal, you will receive your results via MyChart message OR a phone call from our staff.  Please ensure you check your voicemail in the event that you authorized detailed messages to be left on a delegated number. If you have any lab test that is abnormal or we need to change your treatment, we will call you to review the results.  Referrals/Procedures/Imaging: Yes  Follow-Up: Your next appointment:   Your physician recommends that you schedule a follow-up appointment for physical with Dr. de [place].  You will receive a text message or e-mail with a link to a survey about your care and experience with us today! We would greatly appreciate your feedback!   Thanks for letting us be apart of your health journey!!  Primary Care and Sports Medicine   Dr. Raymond de [place]   We encourage you to activate your patient portal called "MyChart".  Sign up information is provided on this After Visit Summary.  MyChart is used to connect with patients for Virtual Visits (Telemedicine).  Patients are able to view lab/test results, encounter notes, upcoming appointments, etc.  Non-urgent messages can be sent to your provider as well. To learn more about what you can do with MyChart, please visit --  [email or web address].    

## 2022-06-14 NOTE — Assessment & Plan Note (Addendum)
Right elbow pain - first started in 1995. Worsened more recently, pain in a few areas around the right elbow, not able to identify one specific spot. Does not completely extend elbow due to pain at end of extension. He is right-handed. No numbness or tingling.  On exam, moderate swelling appreciated along medial aspect of right elbow.  Moderate tenderness to palpation about the elbow, primarily along medial aspect of elbow.  Limited active and passive range of motion, particularly with extension.  Normal strength for flexion and extension at elbow.  There is pain with palpation along medial aspect of elbow as well as with passive attempts at extension primarily.  Distal neurovascular exam is intact.  Normal strength for wrist flexion and extension.  Normal grip strength bilaterally. Concern for intra-articular pathology given history and exam today.  Interestingly, patient did have similar clinical picture with knee issue for which he was seen previously.  At that time, was found to have degree of arthritis, synovitis, notable joint effusion.  He had a somewhat similar findings with right elbow.  Feel that proceeding with initial x-ray is most appropriate and ultimately feel that he will need to meet with orthopedic surgeon to further review treatment options and determine if additional imaging is needed such as MRI

## 2022-06-17 ENCOUNTER — Telehealth (HOSPITAL_BASED_OUTPATIENT_CLINIC_OR_DEPARTMENT_OTHER): Payer: Self-pay | Admitting: Family Medicine

## 2022-06-17 NOTE — Telephone Encounter (Signed)
Patient son called about wht is next step for elbow son called was confused gave him all of his scheduled appointments and what for as well.

## 2022-06-24 NOTE — Telephone Encounter (Signed)
Still waiting on Dr. De [place] to comment on pt imaging.

## 2022-06-27 ENCOUNTER — Other Ambulatory Visit (HOSPITAL_BASED_OUTPATIENT_CLINIC_OR_DEPARTMENT_OTHER): Payer: Self-pay | Admitting: Family Medicine

## 2022-06-27 DIAGNOSIS — M25521 Pain in right elbow: Secondary | ICD-10-CM

## 2022-07-15 ENCOUNTER — Ambulatory Visit (INDEPENDENT_AMBULATORY_CARE_PROVIDER_SITE_OTHER): Payer: 59 | Admitting: Orthopaedic Surgery

## 2022-07-15 ENCOUNTER — Encounter: Payer: Self-pay | Admitting: Orthopaedic Surgery

## 2022-07-15 DIAGNOSIS — M25521 Pain in right elbow: Secondary | ICD-10-CM | POA: Diagnosis not present

## 2022-07-15 MED ORDER — LIDOCAINE HCL 1 % IJ SOLN
2.0000 mL | INTRAMUSCULAR | Status: AC | PRN
  Administered 2022-07-15: 2 mL

## 2022-07-15 MED ORDER — CELECOXIB 100 MG PO CAPS: 100.0000 mg | ORAL_CAPSULE | Freq: Two times a day (BID) | ORAL | 1 refills | Status: DC | PRN

## 2022-07-15 MED ORDER — METHYLPREDNISOLONE ACETATE 40 MG/ML IJ SUSP
1.0000 mg | INTRAMUSCULAR | Status: AC | PRN
  Administered 2022-07-15: 1 mg via INTRA_ARTICULAR

## 2022-07-15 NOTE — Progress Notes (Signed)
The patient is a 67 year old gentleman who comes in with a months worth of right elbow pain with no known injury recently.  However he cannot extend his elbow fully and he can do this a little bit more before a month ago.  He did injure his elbow back in 1955 when he lived in [place].  He does have someone interpreting for him.  On exam his right elbow is swollen but there is no redness.  He does lack full extension by about 10 degrees.  There are some crepitation in the elbow itself but it feels ligamentously stable.  There is no specific area of tenderness but I do feel like there is an elbow effusion.  His biceps tendon distally appears to be intact.  Of note he is right-hand dominant.  I did recommend an intra-articular steroid injection in his right elbow to see if if this works for him and I will put him on an anti-inflammatory for the next 2 weeks.  He did tolerate an injection in his right elbow joint well and I placed this in easily.  I would like to see him back in 2 weeks again for repeat exam.  If he is still having problems with the elbow I would like an AP and lateral of the right elbow.     Procedure Note  Patient: Brian Oliver             Date of Birth: 03-08-1955           MRN: [id number]             Visit Date: 07/15/2022  Procedures: Visit Diagnoses: No diagnosis found.  Medium Joint Inj: R elbow on 07/15/2022 8:37 AM Details: lateral approach Medications: 1 mg methylPREDNISolone acetate 40 MG/ML; 2 mL lidocaine 1 %

## 2022-07-31 ENCOUNTER — Encounter: Payer: Self-pay | Admitting: Orthopaedic Surgery

## 2022-07-31 ENCOUNTER — Ambulatory Visit (INDEPENDENT_AMBULATORY_CARE_PROVIDER_SITE_OTHER): Payer: 59 | Admitting: Orthopaedic Surgery

## 2022-07-31 ENCOUNTER — Other Ambulatory Visit (INDEPENDENT_AMBULATORY_CARE_PROVIDER_SITE_OTHER): Payer: 59

## 2022-07-31 DIAGNOSIS — M25521 Pain in right elbow: Secondary | ICD-10-CM

## 2022-07-31 NOTE — Progress Notes (Signed)
The patient is a 67 year old non-English-speaking gentleman who comes back for follow-up visit related to his right elbow.  He has a remote history of trauma to that elbow many many years ago.  When I saw him 2 weeks ago I did place an intra-articular steroid injection into his right elbow joint.  He says the injection was really not helpful.  He does have interpreter with him today.  On exam he lacks full extension by maybe 5 degrees and his flexion is full.  There is crepitation throughout the flexion and extension arc of his elbow.  Is got pretty good rotation from supination to pronation as well.  X-rays of his right elbow are reviewed and show significant elbow joint osteoarthritis.  There may be a posttraumatic component of this but the alignment is not significantly off.  It is more of just osteophytes and joint space narrowing.  He has likely had a remote injury to that elbow based on the plain films.  There are not a lot of good treatment options for this considering the significance of his right elbow arthritis.  I will least start with sending him to my partner Dr. Steward Drone for his expert opinion as a relates to the patient's right elbow and if the possibility of an arthroscopic intervention would temporize things with his right elbow.  Potentially the only other surgical option would be an elbow arthroplasty.  I am uncertain as to if any surgeon in [place] does that type of surgery and he may need referral to a tertiary care center for this type of definitive intervention.

## 2022-08-07 ENCOUNTER — Ambulatory Visit (INDEPENDENT_AMBULATORY_CARE_PROVIDER_SITE_OTHER): Payer: 59 | Admitting: Orthopaedic Surgery

## 2022-08-07 DIAGNOSIS — M19021 Primary osteoarthritis, right elbow: Secondary | ICD-10-CM | POA: Diagnosis not present

## 2022-08-07 NOTE — Progress Notes (Signed)
Chief Complaint:right elbow pain     History of Present Illness:    08/07/2022: Presents today for follow-up of his right elbow.  At this time he is having persistent pain with limited motion about the elbow.  Denies any history of rheumatoid arthritis.  He has been seen by my partner Dr. Magnus Ivan for an injection of the elbow with very minimal relief.  He is having swelling as well as limited motion.  He is currently retired.  He is from [place] originally.  This is his dominant hand.   Surgical History:   None  PMH/PSH/Family History/Social History/Meds/Allergies:    Past Medical History:  Diagnosis Date   Hepatitis C    Hyperlipidemia    Pre-diabetes    Past Surgical History:  Procedure Laterality Date   HERNIA REPAIR     TENDON REPAIR Left    TOTAL KNEE ARTHROPLASTY Right 03/02/2021   Procedure: Right TOTAL KNEE ARTHROPLASTY;  Surgeon: Kathryne Hitch, MD;  Location: [facility];  Service: Orthopedics;  Laterality: Right;   Social History   Socioeconomic History   Marital status: Married    Spouse name: Not on file   Number of children: Not on file   Years of education: Not on file   Highest education level: Not on file  Occupational History   Not on file  Tobacco Use   Smoking status: Former    Current packs/day: 1.50    Average packs/day: 1.5 packs/day for 10.0 years (15.0 ttl pk-yrs)    Types: Cigarettes    Passive exposure: Never   Smokeless tobacco: Never  Vaping Use   Vaping status: Never Used  Substance and Sexual Activity   Alcohol use: Yes    Alcohol/week: 1.0 standard drink of alcohol    Types: 1 Cans of beer per week    Comment: occas.   Drug use: Never   Sexual activity: Not Currently  Other Topics Concern   Not on file  Social History Narrative   Not on file   Social Determinants of Health   Financial Resource Strain: Not on file  Food Insecurity: Not on file  Transportation Needs: Not on file   Physical Activity: Not on file  Stress: Not on file  Social Connections: Not on file   Family History  Problem Relation Age of Onset   Hypertension Mother    Stroke Father    No Known Allergies Current Outpatient Medications  Medication Sig Dispense Refill   celecoxib (CELEBREX) 100 MG capsule Take 1 capsule (100 mg total) by mouth 2 (two) times daily between meals as needed. 60 capsule 1   No current facility-administered medications for this visit.   No results found.  Review of Systems:   A ROS was performed including pertinent positives and negatives as documented in the HPI.  Physical Exam :   Constitutional: NAD and appears stated age Neurological: Alert and oriented Psych: Appropriate affect and cooperative There were no vitals taken for this visit.   Comprehensive Musculoskeletal Exam:      Right elbow range of motion is from 30 to 100 degrees with pain.  He has approximately 20 degree arc of pro supination.  There is positive crepitus.  Distal neurosensory exam is intact   Imaging:   Xray (Right elbow 3 views): Advanced elbow osteoarthritis  I personally reviewed and interpreted the radiographs.   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  Unfortunately I do believe that this is past the point where arthroscopy could be beneficial and to that effect I do believe that he may ultimately be a candidate for an elbow arthroplasty versus resurfacing.  At this time I do believe that he would benefit from a tertiary care center like [facility] for referral of this and further discussion.  Will plan to make referral  Plan :    -Plan for referral to [facility] for discussion of possible elbow arthroplasty     I personally saw and evaluated the patient, and participated in the management and treatment plan.  Huel Cote, MD Attending Physician, Orthopedic Surgery  This document was dictated using Dragon voice recognition software. A  reasonable attempt at proof reading has been made to minimize errors.

## 2022-09-30 ENCOUNTER — Other Ambulatory Visit (HOSPITAL_BASED_OUTPATIENT_CLINIC_OR_DEPARTMENT_OTHER): Payer: 59

## 2022-09-30 DIAGNOSIS — Z0000 Encounter for general adult medical examination without abnormal findings: Secondary | ICD-10-CM

## 2022-10-01 LAB — CBC WITH DIFFERENTIAL/PLATELET
Basophils Absolute: 0.1 10*3/uL (ref 0.0–0.2)
Basos: 1 %
EOS (ABSOLUTE): 0.3 10*3/uL (ref 0.0–0.4)
Eos: 4 %
Hematocrit: 43.5 % (ref 37.5–51.0)
Hemoglobin: 14.3 g/dL (ref 13.0–17.7)
Immature Grans (Abs): 0 10*3/uL (ref 0.0–0.1)
Immature Granulocytes: 0 %
Lymphocytes Absolute: 1.9 10*3/uL (ref 0.7–3.1)
Lymphs: 26 %
MCH: 31.5 pg (ref 26.6–33.0)
MCHC: 32.9 g/dL (ref 31.5–35.7)
MCV: 96 fL (ref 79–97)
Monocytes Absolute: 0.6 10*3/uL (ref 0.1–0.9)
Monocytes: 8 %
Neutrophils Absolute: 4.5 10*3/uL (ref 1.4–7.0)
Neutrophils: 61 %
Platelets: 273 10*3/uL (ref 150–450)
RBC: 4.54 x10E6/uL (ref 4.14–5.80)
RDW: 13.3 % (ref 11.6–15.4)
WBC: 7.4 10*3/uL (ref 3.4–10.8)

## 2022-10-01 LAB — COMPREHENSIVE METABOLIC PANEL
ALT: 10 IU/L (ref 0–44)
AST: 17 IU/L (ref 0–40)
Albumin: 4.5 g/dL (ref 3.9–4.9)
Alkaline Phosphatase: 89 IU/L (ref 44–121)
BUN/Creatinine Ratio: 19 (ref 10–24)
BUN: 19 mg/dL (ref 8–27)
Bilirubin Total: 0.4 mg/dL (ref 0.0–1.2)
CO2: 21 mmol/L (ref 20–29)
Calcium: 9.6 mg/dL (ref 8.6–10.2)
Chloride: 105 mmol/L (ref 96–106)
Creatinine, Ser: 0.98 mg/dL (ref 0.76–1.27)
Globulin, Total: 3 g/dL (ref 1.5–4.5)
Glucose: 97 mg/dL (ref 70–99)
Potassium: 5.4 mmol/L — ABNORMAL HIGH (ref 3.5–5.2)
Sodium: 142 mmol/L (ref 134–144)
Total Protein: 7.5 g/dL (ref 6.0–8.5)
eGFR: 84 mL/min/{1.73_m2} (ref >59)

## 2022-10-01 LAB — LIPID PANEL
Chol/HDL Ratio: 6.3 ratio — ABNORMAL HIGH (ref 0.0–5.0)
Cholesterol, Total: 282 mg/dL — ABNORMAL HIGH (ref 100–199)
HDL: 45 mg/dL (ref >39)
LDL Chol Calc (NIH): 197 mg/dL — ABNORMAL HIGH (ref 0–99)
Triglycerides: 207 mg/dL — ABNORMAL HIGH (ref 0–149)
VLDL Cholesterol Cal: 40 mg/dL (ref 5–40)

## 2022-10-01 LAB — TSH RFX ON ABNORMAL TO FREE T4: TSH: 7.2 u[IU]/mL — ABNORMAL HIGH (ref 0.450–4.500)

## 2022-10-01 LAB — HEMOGLOBIN A1C
Est. average glucose Bld gHb Est-mCnc: 123 mg/dL
Hgb A1c MFr Bld: 5.9 % — ABNORMAL HIGH (ref 4.8–5.6)

## 2022-10-01 LAB — T4F: T4,Free (Direct): 1.1 ng/dL (ref 0.82–1.77)

## 2022-10-10 ENCOUNTER — Other Ambulatory Visit (HOSPITAL_BASED_OUTPATIENT_CLINIC_OR_DEPARTMENT_OTHER): Payer: 59

## 2022-10-11 ENCOUNTER — Telehealth: Payer: Self-pay | Admitting: Orthopaedic Surgery

## 2022-10-11 NOTE — Telephone Encounter (Signed)
Patient's son has left two messages with me this week in regards to scheduling patient a surgery. Per last ov note 08/07/22 Dr. Steward Drone wanted to refer patient to [facility] for possible surgery there. Per son he is not sure if anyone ever called his father to set up an appointment at [facility]. Could you please check to see if a referral was put in, and put the son Blossom Hoops) down as the contact to schedule this appointment? Please call the son to advise next step in this process.

## 2022-10-14 NOTE — Telephone Encounter (Signed)
Referral has been refaxed to [phone]

## 2022-10-15 ENCOUNTER — Ambulatory Visit (INDEPENDENT_AMBULATORY_CARE_PROVIDER_SITE_OTHER): Payer: 59 | Admitting: Family Medicine

## 2022-10-15 ENCOUNTER — Encounter (HOSPITAL_BASED_OUTPATIENT_CLINIC_OR_DEPARTMENT_OTHER): Payer: Self-pay | Admitting: Family Medicine

## 2022-10-15 VITALS — BP 146/82 | HR 53 | Ht 65.0 in | Wt 208.0 lb

## 2022-10-15 DIAGNOSIS — E038 Other specified hypothyroidism: Secondary | ICD-10-CM | POA: Diagnosis not present

## 2022-10-15 DIAGNOSIS — Z0000 Encounter for general adult medical examination without abnormal findings: Secondary | ICD-10-CM | POA: Diagnosis not present

## 2022-10-15 DIAGNOSIS — Z23 Encounter for immunization: Secondary | ICD-10-CM | POA: Diagnosis not present

## 2022-10-15 DIAGNOSIS — E785 Hyperlipidemia, unspecified: Secondary | ICD-10-CM | POA: Diagnosis not present

## 2022-10-15 MED ORDER — ROSUVASTATIN CALCIUM 5 MG PO TABS: 5.0000 mg | ORAL_TABLET | Freq: Every day | ORAL | 1 refills | Status: DC

## 2022-10-15 NOTE — Progress Notes (Signed)
Subjective:    CC: Annual Physical Exam Accompanied by in-person interpreter  HPI:  Brian Oliver is a 67 y.o. presenting for annual physical  I reviewed the past medical history, family history, social history, surgical history, and allergies today and no changes were needed.  Please see the problem list section below in epic for further details.  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   Procedure: Right TOTAL KNEE ARTHROPLASTY;  Surgeon: Kathryne Hitch, MD;  Location: [facility];  Service: Orthopedics;  Laterality: Right;   Social History: Social History   Socioeconomic History   Marital status: Married    Spouse name: Not on file   Number of children: Not on file   Years of education: Not on file   Highest education level: Not on file  Occupational History   Not on file  Tobacco Use   Smoking status: Former    Current packs/day: 1.50    Average packs/day: 1.5 packs/day for 10.0 years (15.0 ttl pk-yrs)    Types: Cigarettes    Passive exposure: Never   Smokeless tobacco: Never  Vaping Use   Vaping status: Never Used  Substance and Sexual Activity   Alcohol use: Yes    Alcohol/week: 1.0 standard drink of alcohol    Types: 1 Cans of beer per week    Comment: occas.   Drug use: Never   Sexual activity: Not Currently  Other Topics Concern   Not on file  Social History Narrative   Not on file   Social Determinants of Health   Financial Resource Strain: Not on file  Food Insecurity: Not on file  Transportation Needs: Not on file  Physical Activity: Not on file  Stress: Not on file  Social Connections: Not on file   Family History: Family History  Problem Relation Age of Onset   Hypertension Mother    Stroke Father     Allergies: No Known Allergies Medications: See med rec.  Review of Systems: No headache, visual changes, nausea, vomiting, diarrhea, constipation, dizziness, abdominal pain, skin rash, fevers, chills, night sweats, swollen lymph nodes, weight loss, chest pain, body aches, joint swelling, muscle aches, shortness of breath, mood changes, visual or auditory hallucinations.  Objective:    BP (!) 146/82   Pulse (!) 53   Ht 5\' 5"  (1.651 m)   Wt 208 lb (94.3 kg)   SpO2 98%   BMI 34.61 kg/m   General: Well Developed, well nourished, and in no acute distress. Neuro: Alert and oriented x3, extra-ocular muscles intact, sensation grossly intact. Cranial nerves II through XII are intact, motor, sensory, and coordinative functions are all intact. HEENT: Normocephalic, atraumatic, pupils equal round reactive to light, neck supple, no masses, no lymphadenopathy, thyroid nonpalpable. Oropharynx, nasopharynx, external ear canals are unremarkable. Skin: Warm and dry, no rashes noted. Cardiac: Regular rate and rhythm, no murmurs rubs or gallops. Respiratory: Clear to auscultation bilaterally. Not using accessory muscles, speaking in full sentences. Abdominal: Soft, nontender, nondistended, positive bowel sounds, no masses, no organomegaly. Musculoskeletal: Shoulder, elbow, wrist, hip, knee, ankle stable, and with full range of motion.  Impression and Recommendations:    Wellness examination Assessment & Plan: Routine HCM labs reviewed. HCM reviewed/discussed. Anticipatory guidance regarding healthy  weight, lifestyle and choices given. Recommend healthy diet.  Recommend approximately 150 minutes/week of moderate intensity exercise Recommend regular dental and vision exams Always use seatbelt/lap and shoulder restraints Recommend using smoke alarms and checking batteries at least twice a year Recommend using sunscreen when outside Discussed colon cancer screening recommendations, options.  Patient  will consider and let us know how he would like to proceed Discussed recommendations for shingles vaccine. Discussed tetanus immunization recommendations, patient declines Recommend pneumococcal vaccine, administered today   Hyperlipidemia, unspecified hyperlipidemia type Assessment & Plan: Discussed cholesterol panel today and findings that ASCVD risk is notably elevated at 25%.  Discussed meaning of this.  Given this elevation, recommendation would be to utilize statin therapy in conjunction with lifestyle modification to help with lowering risk of heart attack and stroke.  After discussion, patient amenable.  We will start with low-dose of rosuvastatin and plan for repeat fasting lipid panel in about 2 months to monitor response to medication and lifestyle modifications.  We will schedule appointment shortly after that to review labs and progress with medication He does report family history of stroke in father.  Orders: -     Rosuvastatin Calcium; Take 1 tablet (5 mg total) by mouth daily.  Dispense: 60 tablet; Refill: 1 -     Lipid panel; Future  Subclinical hypothyroidism Assessment & Plan: Elevated TSH, normal free T4.  Discussed labs with patient.  Currently asymptomatic, would plan to recheck thyroid labs in about 6 months unless symptoms develop sooner   Need for pneumococcal vaccination  Need for pneumococcal 20-valent conjugate vaccination -     Pneumococcal conjugate vaccine 20-valent  Return in about 2 months (around 12/15/2022) for hyperlipidemia, med check.   ___________________________________________ Ariany Kesselman de [place], MD, ABFM, CAQSM Primary Care and Sports Medicine [facility]

## 2022-10-15 NOTE — Assessment & Plan Note (Signed)
Discussed cholesterol panel today and findings that ASCVD risk is notably elevated at 25%.  Discussed meaning of this.  Given this elevation, recommendation would be to utilize statin therapy in conjunction with lifestyle modification to help with lowering risk of heart attack and stroke.  After discussion, patient amenable.  We will start with low-dose of rosuvastatin and plan for repeat fasting lipid panel in about 2 months to monitor response to medication and lifestyle modifications.  We will schedule appointment shortly after that to review labs and progress with medication He does report family history of stroke in father.

## 2022-10-15 NOTE — Assessment & Plan Note (Signed)
Routine HCM labs reviewed. HCM reviewed/discussed. Anticipatory guidance regarding healthy weight, lifestyle and choices given. Recommend healthy diet.  Recommend approximately 150 minutes/week of moderate intensity exercise Recommend regular dental and vision exams Always use seatbelt/lap and shoulder restraints Recommend using smoke alarms and checking batteries at least twice a year Recommend using sunscreen when outside Discussed colon cancer screening recommendations, options.  Patient will consider and let us know how he would like to proceed Discussed recommendations for shingles vaccine. Discussed tetanus immunization recommendations, patient declines Recommend pneumococcal vaccine, administered today

## 2022-10-15 NOTE — Assessment & Plan Note (Signed)
Elevated TSH, normal free T4.  Discussed labs with patient.  Currently asymptomatic, would plan to recheck thyroid labs in about 6 months unless symptoms develop sooner

## 2022-10-18 ENCOUNTER — Telehealth (HOSPITAL_BASED_OUTPATIENT_CLINIC_OR_DEPARTMENT_OTHER): Payer: Self-pay | Admitting: Family Medicine

## 2022-10-18 NOTE — Telephone Encounter (Signed)
Prior auth received for pt's rosuvastatin. When trying to attempt the prior auth, it asked a question which is shown below:  Is the pt unable to use, or has adequately tried/tailed atorvastatin at a dose of 40mg  to 80mg  per day-yes/no?   Looked at pt's past med history and saw that pt has never been on atorvastatin.   Dr. De [place], please advise if you are okay switching pt to atorvastatin since it seems like it is the preferred med.

## 2022-10-23 MED ORDER — ATORVASTATIN CALCIUM 20 MG PO TABS: 20.0000 mg | ORAL_TABLET | Freq: Every day | ORAL | 1 refills | Status: AC

## 2022-10-24 NOTE — Telephone Encounter (Signed)
Sent pt a Clinical cytogeneticist message after translating it from [place] to Spanish to let pt know about the change in his cholesterol medication.

## 2022-11-05 ENCOUNTER — Telehealth (HOSPITAL_BASED_OUTPATIENT_CLINIC_OR_DEPARTMENT_OTHER): Payer: Self-pay | Admitting: Family Medicine

## 2022-11-05 NOTE — Telephone Encounter (Signed)
Brian Oliver can you help with this please

## 2022-11-05 NOTE — Telephone Encounter (Signed)
Pt came in regarding the referral placed by Dr Steward Drone, the referral was placed to a [facility] provider. The patient has [employer], and does not speak english.   The patient wants to be seen by a [facility] provider or someone in his network.  Please let me know ---I can call the patient to let me know also   Pt would like you to contact the son Lucan @ [phone]

## 2022-11-06 NOTE — Telephone Encounter (Signed)
Spoke to son and informed him no one in [facility] does the procedure his dad needs. Blossom Hoops said he will discuss with his dad about potentially changing insurance and will call back if there are any updates.

## 2022-11-22 ENCOUNTER — Telehealth (HOSPITAL_BASED_OUTPATIENT_CLINIC_OR_DEPARTMENT_OTHER): Payer: Self-pay | Admitting: Orthopaedic Surgery

## 2022-11-22 NOTE — Telephone Encounter (Signed)
Patient states dr rush does not take [employer] and needs to be referred to another specialist from dr boshan's referral.

## 2022-11-22 NOTE — Telephone Encounter (Signed)
The primary Floor from 3 floor called while patient was in there office and patient states that his insurance does not cover the the Dr that Dr B sent out. Please contact the patient he has [employer] but needs a different referral sent to a different Dr that takes his insurance.

## 2022-11-25 NOTE — Telephone Encounter (Signed)
Called and spoke to son. Advised on message below. He states he did speak with Patient and they cannot change insurance at this time. They contacted [employer] to see who is in network and able to do the procedure. They are waiting for insurance company.

## 2022-12-10 ENCOUNTER — Other Ambulatory Visit (HOSPITAL_BASED_OUTPATIENT_CLINIC_OR_DEPARTMENT_OTHER): Payer: 59

## 2022-12-18 ENCOUNTER — Ambulatory Visit (HOSPITAL_BASED_OUTPATIENT_CLINIC_OR_DEPARTMENT_OTHER): Payer: 59 | Admitting: Family Medicine

## 2023-01-13 ENCOUNTER — Other Ambulatory Visit (HOSPITAL_BASED_OUTPATIENT_CLINIC_OR_DEPARTMENT_OTHER): Payer: 59

## 2023-01-20 ENCOUNTER — Ambulatory Visit (HOSPITAL_BASED_OUTPATIENT_CLINIC_OR_DEPARTMENT_OTHER): Payer: 59 | Admitting: Family Medicine

## 2023-03-28 IMAGING — MR MR KNEE*R* W/O CM
5 of 7 series · 25 of 40 positions shown · non-contrast
Comparison: Radiograph 07/26/2020

CLINICAL DATA: Chronic knee pain with swelling and instability.
Previous knee aspiration and steroid injection. No previous relevant
surgery.

EXAM:
MRI OF THE RIGHT KNEE WITHOUT CONTRAST
TECHNIQUE: Multiplanar, multisequence MR imaging of the knee was performed. No
intravenous contrast was administered.

[Series 3: T2 fat-sat · axial · 4.0mm · 0.50mm/px · z∈[-91,+59]mm · 6 of 31 slices shown (1 of 2)]
[im 1/31]
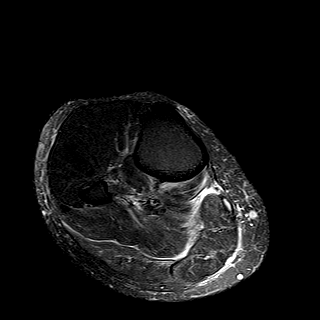
[im 7/31]
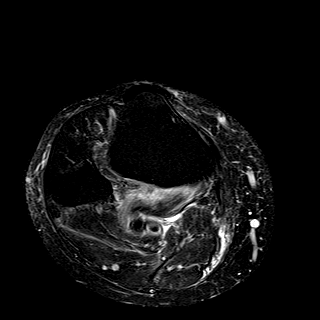
[im 13/31]
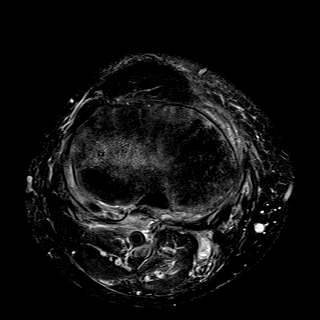
[im 19/31]
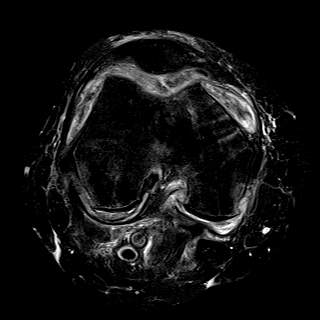
[im 25/31]
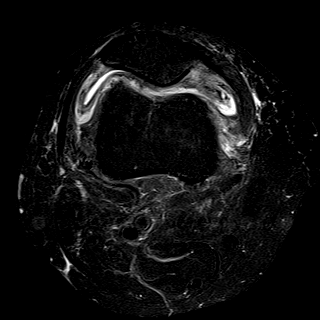
[im 31/31]
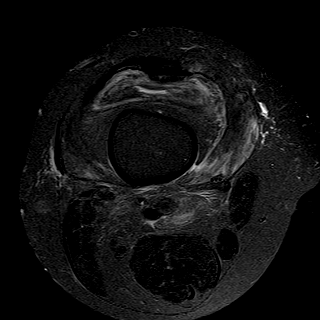

[Series 5: T2 fat-sat · coronal · 4.0mm · 0.29mm/px · 3 of 30 slices shown (2 of 2)]
[im 1/30]
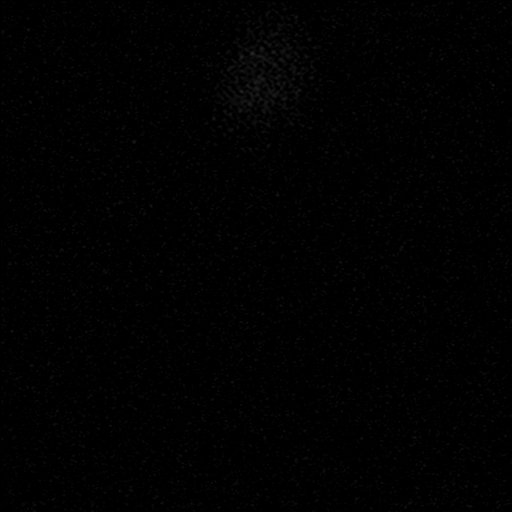
[im 6/30]
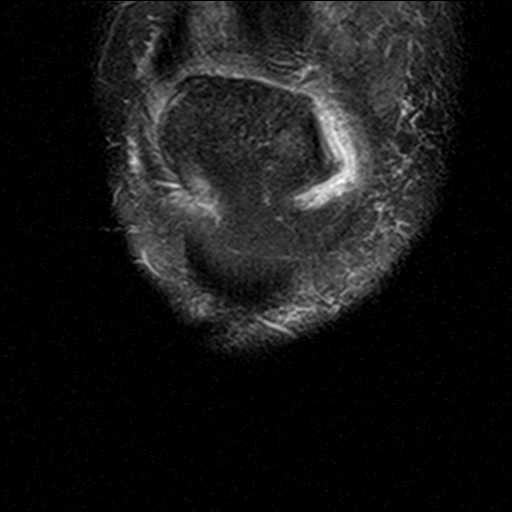
[im 12/30]
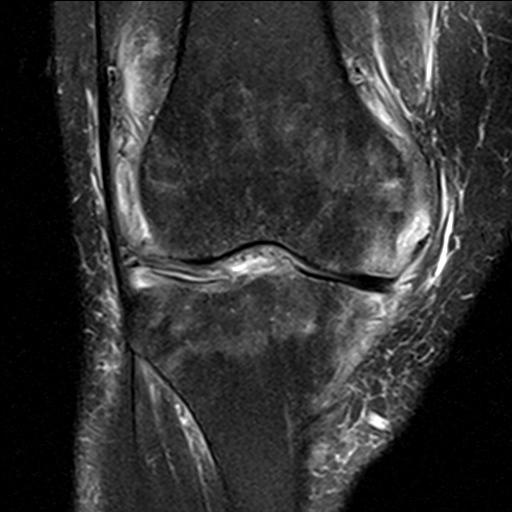

[Series 6: PD fat-sat · coronal · 4.0mm · 0.29mm/px · 6 of 30 slices shown (1 of 3)]
[im 1/30]
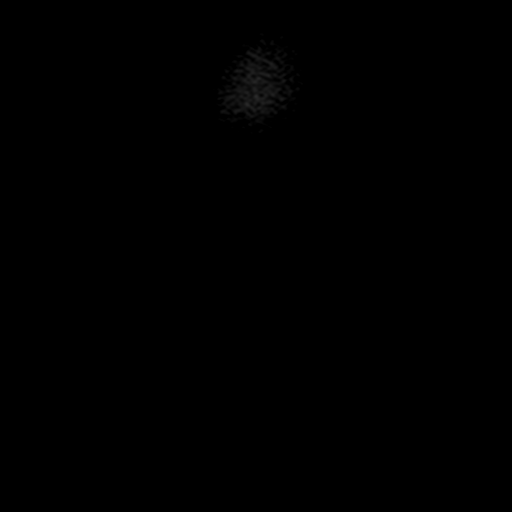
[im 6/30]
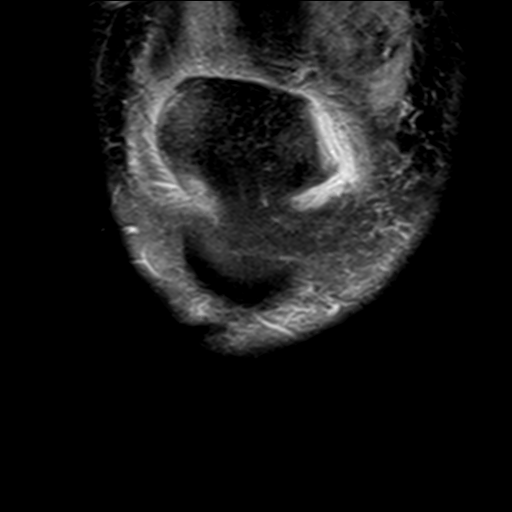
[im 12/30]
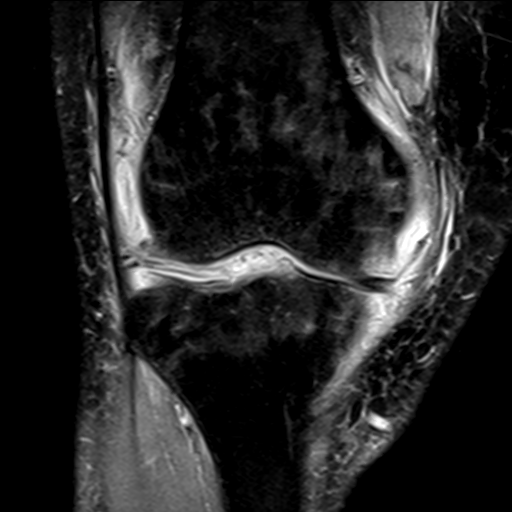
[im 18/30]
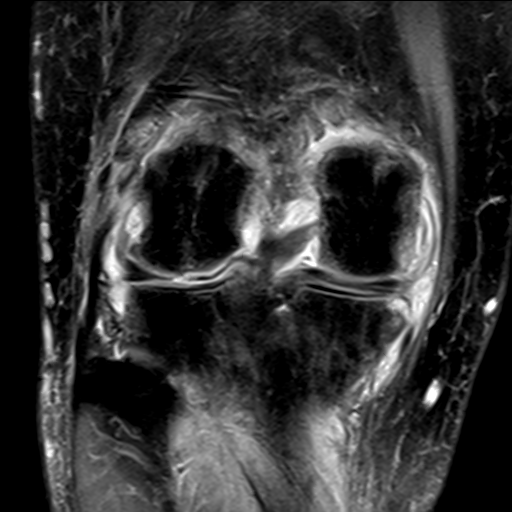
[im 24/30]
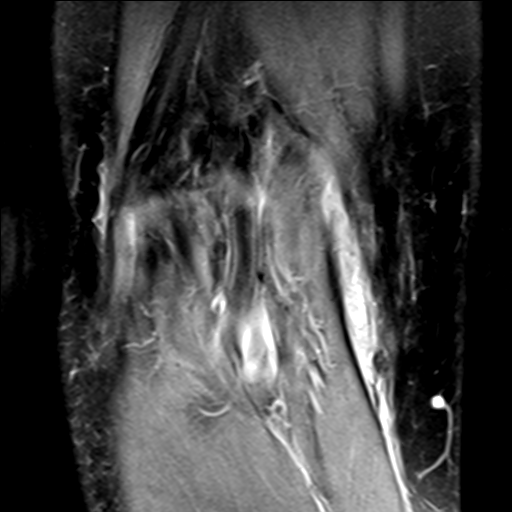
[im 30/30]
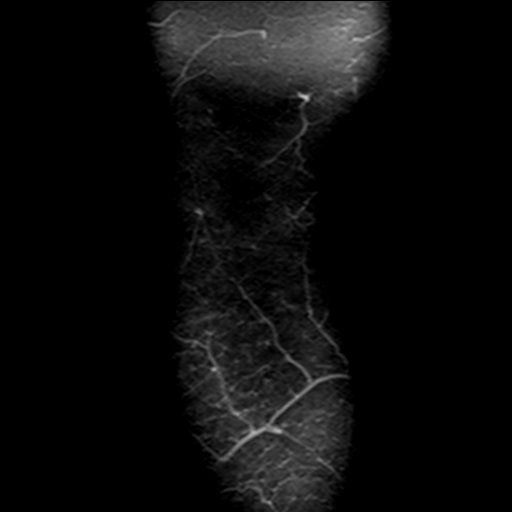

[Series 7: PD fat-sat · sagittal · 3.0mm · 0.29mm/px · 6 of 34 slices shown (2 of 3)]
[im 1/34]
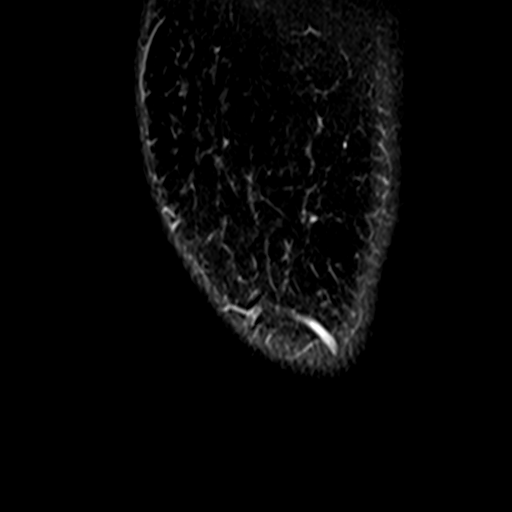
[im 7/34]
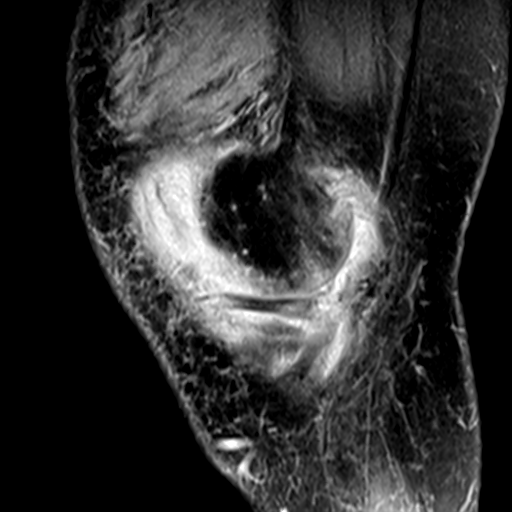
[im 14/34]
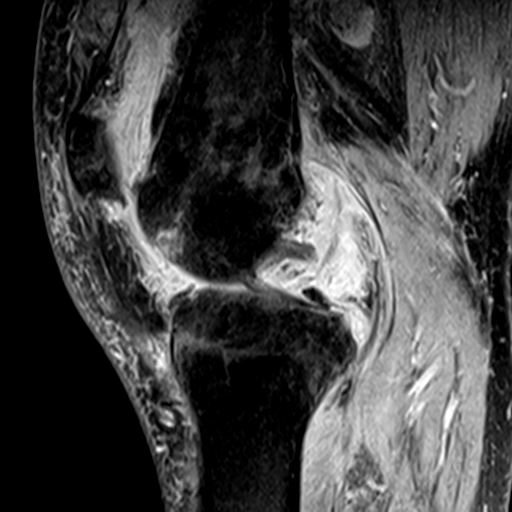
[im 20/34]
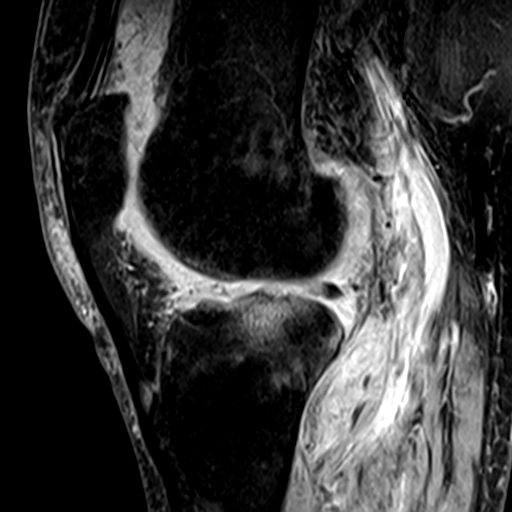
[im 27/34]
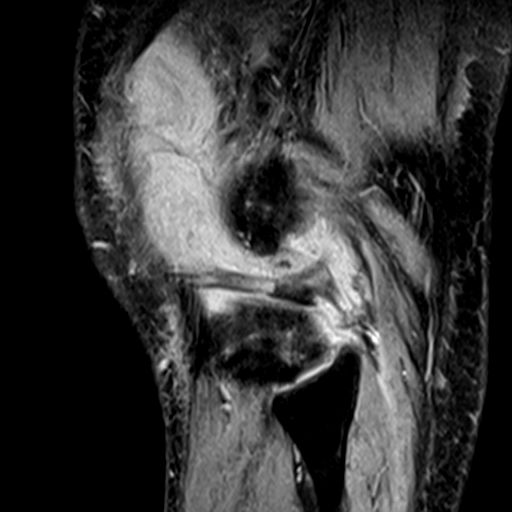
[im 34/34]
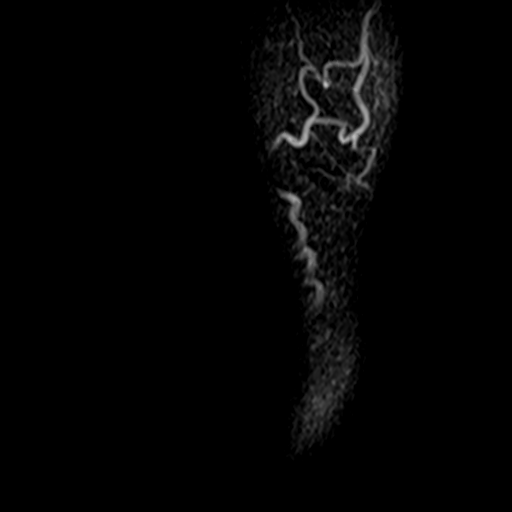

[Series 9: PD fat-sat · coronal · 2.0mm · 0.59mm/px · 4 of 21 slices shown (3 of 3)]
[im 1/21]
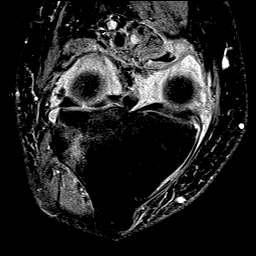
[im 7/21]
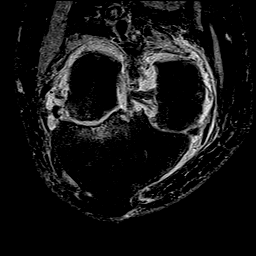
[im 14/21]
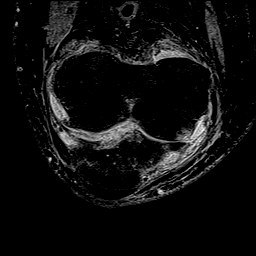
[im 21/21]
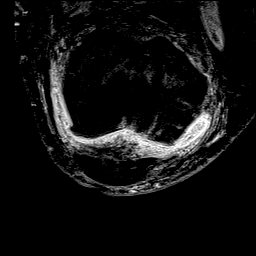

[25 of 40 positions shown; findings below may reference images not displayed]

FINDINGS: MENISCI

Medial meniscus: Peripheral degenerative tearing at the junction of
the posterior horn and body, best seen on the coronal images. The
meniscal root is intact. No centrally displaced meniscal fragment.

Lateral meniscus: Diffuse degenerative tearing of the meniscal body
and anterior horn. No centrally displaced meniscal fragment.

LIGAMENTS

Cruciates:  Intact.

Collaterals:  Intact.

CARTILAGE

Patellofemoral: Mild patellar chondral thinning and surface
irregularity without focal defect. There is subchondral cyst
formation centrally in the trochlea.

Medial: Mild chondral thinning, surface irregularity and subchondral
edema.

Lateral: Advanced chondral thinning with surface irregularity and
subchondral cyst formation in the lateral tibial plateau.

MISCELLANEOUS

Joint: Moderate-sized complex joint effusion with diffuse synovial
irregularity suspicious for extensive synovitis.

Popliteal Fossa: Small Baker's cyst. There is complex fluid within
the popliteus hiatus.

Extensor Mechanism:  Intact.

Bones: Probable generalized reactive periarticular edema. No
evidence of acute fracture or cortical destruction.

Other: No other significant periarticular soft tissue findings.
IMPRESSION: 1. Complex joint effusion consistent with severe synovitis, likely
indicative of chronic inflammatory or crystalline arthropathy.
2. Tricompartmental joint space narrowing and reactive marrow
changes, most advanced in the lateral compartment.
3. Diffuse degenerative tearing of the body and anterior horn of the
lateral meniscus. Small peripheral degenerative tear of the medial
meniscus.
4. No acute ligamentous findings.

## 2023-04-04 ENCOUNTER — Other Ambulatory Visit (HOSPITAL_COMMUNITY): Payer: Self-pay | Admitting: Orthopaedic Surgery

## 2023-04-04 DIAGNOSIS — M25521 Pain in right elbow: Secondary | ICD-10-CM

## 2023-04-10 ENCOUNTER — Ambulatory Visit (HOSPITAL_COMMUNITY): Admission: RE | Admit: 2023-04-10 | Source: Ambulatory Visit

## 2023-04-17 ENCOUNTER — Ambulatory Visit (HOSPITAL_BASED_OUTPATIENT_CLINIC_OR_DEPARTMENT_OTHER)
Admission: RE | Admit: 2023-04-17 | Discharge: 2023-04-17 | Disposition: A | Discharge status: Home / Self Care | Source: Ambulatory Visit | Attending: Orthopaedic Surgery | Admitting: Orthopaedic Surgery

## 2023-04-17 ENCOUNTER — Encounter (HOSPITAL_BASED_OUTPATIENT_CLINIC_OR_DEPARTMENT_OTHER): Payer: Self-pay | Admitting: Family Medicine

## 2023-04-17 ENCOUNTER — Encounter (HOSPITAL_BASED_OUTPATIENT_CLINIC_OR_DEPARTMENT_OTHER): Payer: Self-pay

## 2023-04-17 DIAGNOSIS — M25521 Pain in right elbow: Secondary | ICD-10-CM

## 2023-11-10 ENCOUNTER — Encounter: Payer: Self-pay | Admitting: Radiology
